# Patient Record
Sex: Female | Born: 1980 | Race: Asian | Hispanic: No | Marital: Married | Smoking: Never smoker
Health system: Southern US, Community
[De-identification: ages and names within clinical notes are randomized; demographics above are authoritative.]

## PROBLEM LIST (undated history)

## (undated) HISTORY — PX: NASAL SINUS SURGERY: SHX719

---

## 2013-04-19 HISTORY — PX: AUGMENTATION MAMMAPLASTY: SUR837

## 2016-01-27 ENCOUNTER — Encounter: Payer: Self-pay | Admitting: Family Medicine

## 2016-01-27 ENCOUNTER — Ambulatory Visit (INDEPENDENT_AMBULATORY_CARE_PROVIDER_SITE_OTHER): Payer: Commercial Managed Care - HMO | Admitting: Nurse Practitioner

## 2016-01-27 ENCOUNTER — Encounter: Payer: Self-pay | Admitting: Nurse Practitioner

## 2016-01-27 VITALS — BP 110/62 | Ht 66.0 in | Wt 168.6 lb

## 2016-01-27 DIAGNOSIS — B3731 Acute candidiasis of vulva and vagina: Secondary | ICD-10-CM

## 2016-01-27 DIAGNOSIS — R5383 Other fatigue: Secondary | ICD-10-CM | POA: Diagnosis not present

## 2016-01-27 DIAGNOSIS — Z13220 Encounter for screening for lipoid disorders: Secondary | ICD-10-CM | POA: Diagnosis not present

## 2016-01-27 DIAGNOSIS — Z79899 Other long term (current) drug therapy: Secondary | ICD-10-CM

## 2016-01-27 DIAGNOSIS — Z1151 Encounter for screening for human papillomavirus (HPV): Secondary | ICD-10-CM

## 2016-01-27 DIAGNOSIS — R87619 Unspecified abnormal cytological findings in specimens from cervix uteri: Secondary | ICD-10-CM

## 2016-01-27 DIAGNOSIS — Z01419 Encounter for gynecological examination (general) (routine) without abnormal findings: Secondary | ICD-10-CM

## 2016-01-27 DIAGNOSIS — B373 Candidiasis of vulva and vagina: Secondary | ICD-10-CM | POA: Diagnosis not present

## 2016-01-27 DIAGNOSIS — Z124 Encounter for screening for malignant neoplasm of cervix: Secondary | ICD-10-CM

## 2016-01-27 DIAGNOSIS — Z91011 Allergy to milk products: Secondary | ICD-10-CM | POA: Diagnosis not present

## 2016-01-27 MED ORDER — FLUCONAZOLE 150 MG PO TABS: ORAL_TABLET | ORAL | 0 refills | Status: DC

## 2016-01-27 NOTE — Progress Notes (Signed)
   Subjective:    Patient ID: Judy Johnson, female    DOB: 10-16-1980, 35 y.o.   MRN: [id number]  HPI presents for her preventive health physical. Patient is from [place], some difficulty obtaining history. Speaks fair English. Married, same partner. He has had a vasectomy. Patient having regular menses, normal flow lasting about 6 days. She is a new patient, has gained about 10 pounds over the past year. Regular dental care. Some walking as activity. Overall healthy diet. Her husband is present at the end of the visit and mentioned that she's been having some rashes when she has anything dairy. Requesting referral to allergy specialist for evaluation. Difficulty obtaining a family history, nothing significant from what can be understood.    Review of Systems  Constitutional: Negative for activity change, appetite change, fatigue and fever.  HENT: Negative for dental problem, ear pain, sinus pressure and sore throat.   Eyes: Negative for visual disturbance.  Respiratory: Negative for cough, chest tightness, shortness of breath and wheezing.   Cardiovascular: Negative for chest pain.  Gastrointestinal: Negative for abdominal distention, abdominal pain, constipation, diarrhea, nausea and vomiting.  Genitourinary: Positive for vaginal discharge. Negative for difficulty urinating, dysuria, enuresis, frequency, genital sores, menstrual problem, pelvic pain and urgency.       Mild external vaginal itching for the past few days, mainly in the am. Has noticed white clumpy discharge.        Objective:   Physical Exam  Constitutional: She is oriented to person, place, and time. She appears well-developed. No distress.  HENT:  Right Ear: External ear normal.  Left Ear: External ear normal.  Mouth/Throat: Oropharynx is clear and moist.  Neck: Normal range of motion. Neck supple. No tracheal deviation present. No thyromegaly present.  Cardiovascular: Normal rate, regular rhythm and normal heart  sounds.  Exam reveals no gallop.   No murmur heard. Pulmonary/Chest: Effort normal and breath sounds normal.  Abdominal: Soft. She exhibits no distension. There is no tenderness.  Genitourinary: Vagina normal and uterus normal. No vaginal discharge found.  Genitourinary Comments: External GU no rashes or lesions. Vagina moderate amount of clumpy white discharge noted. Cervix normal in appearance. No CMT. Bimanual exam no tenderness or obvious masses.  Musculoskeletal: She exhibits no edema.  Lymphadenopathy:    She has no cervical adenopathy.  Neurological: She is alert and oriented to person, place, and time.  Skin: Skin is warm and dry. No rash noted.  Psychiatric: She has a normal mood and affect. Her behavior is normal.  Vitals reviewed.  Breast exam: Extremely dense tissue, no obvious masses. Axillae no adenopathy.        Assessment & Plan:  Well woman exam - Plan: Pap IG and HPV (high risk) DNA detection  Screening for cervical cancer - Plan: Pap IG and HPV (high risk) DNA detection  Screening for HPV (human papillomavirus) - Plan: Pap IG and HPV (high risk) DNA detection  Vaginal candidiasis  Fatigue, unspecified type - Plan: CBC with Differential/Platelet, TSH, VITAMIN D 25 Hydroxy (Vit-D Deficiency, Fractures)  High risk medication use - Plan: Basic metabolic panel, Hepatic function panel  Screening cholesterol level - Plan: Lipid panel  Encouraged increased activity and healthy diet. Will refer to allergy specialist for evaluation per request. Return in about 1 year (around 01/26/2017) for physical.

## 2016-02-03 LAB — PAP IG AND HPV HIGH-RISK
HPV, HIGH-RISK: POSITIVE — AB
PAP SMEAR COMMENT: 0

## 2016-02-06 ENCOUNTER — Encounter: Payer: Self-pay | Admitting: Nurse Practitioner

## 2016-02-06 DIAGNOSIS — R87612 Low grade squamous intraepithelial lesion on cytologic smear of cervix (LGSIL): Secondary | ICD-10-CM | POA: Insufficient documentation

## 2016-02-06 DIAGNOSIS — R87810 Cervical high risk human papillomavirus (HPV) DNA test positive: Secondary | ICD-10-CM | POA: Insufficient documentation

## 2016-02-07 LAB — BASIC METABOLIC PANEL
BUN/Creatinine Ratio: 15 (ref 9–23)
BUN: 12 mg/dL (ref 6–20)
CALCIUM: 9.6 mg/dL (ref 8.7–10.2)
CHLORIDE: 102 mmol/L (ref 96–106)
CO2: 23 mmol/L (ref 18–29)
CREATININE: 0.8 mg/dL (ref 0.57–1.00)
GFR calc Af Amer: 111 mL/min/{1.73_m2} (ref >59)
GFR calc non Af Amer: 96 mL/min/{1.73_m2} (ref >59)
GLUCOSE: 96 mg/dL (ref 65–99)
Potassium: 4.5 mmol/L (ref 3.5–5.2)
Sodium: 141 mmol/L (ref 134–144)

## 2016-02-07 LAB — VITAMIN D 25 HYDROXY (VIT D DEFICIENCY, FRACTURES): VIT D 25 HYDROXY: 18.5 ng/mL — AB (ref 30.0–100.0)

## 2016-02-07 LAB — HEPATIC FUNCTION PANEL
ALK PHOS: 55 IU/L (ref 39–117)
ALT: 12 IU/L (ref 0–32)
AST: 14 IU/L (ref 0–40)
Albumin: 4.5 g/dL (ref 3.5–5.5)
Bilirubin Total: 0.5 mg/dL (ref 0.0–1.2)
Bilirubin, Direct: 0.1 mg/dL (ref 0.00–0.40)
Total Protein: 7.6 g/dL (ref 6.0–8.5)

## 2016-02-07 LAB — CBC WITH DIFFERENTIAL/PLATELET
BASOS ABS: 0 10*3/uL (ref 0.0–0.2)
BASOS: 0 %
EOS (ABSOLUTE): 0.3 10*3/uL (ref 0.0–0.4)
Eos: 7 %
HEMOGLOBIN: 13.7 g/dL (ref 11.1–15.9)
Hematocrit: 40.7 % (ref 34.0–46.6)
IMMATURE GRANS (ABS): 0 10*3/uL (ref 0.0–0.1)
IMMATURE GRANULOCYTES: 0 %
LYMPHS: 40 %
Lymphocytes Absolute: 1.9 10*3/uL (ref 0.7–3.1)
MCH: 29.5 pg (ref 26.6–33.0)
MCHC: 33.7 g/dL (ref 31.5–35.7)
MCV: 88 fL (ref 79–97)
MONOCYTES: 6 %
Monocytes Absolute: 0.3 10*3/uL (ref 0.1–0.9)
NEUTROS PCT: 47 %
Neutrophils Absolute: 2.2 10*3/uL (ref 1.4–7.0)
PLATELETS: 304 10*3/uL (ref 150–379)
RBC: 4.64 x10E6/uL (ref 3.77–5.28)
RDW: 14.5 % (ref 12.3–15.4)
WBC: 4.8 10*3/uL (ref 3.4–10.8)

## 2016-02-07 LAB — LIPID PANEL
CHOLESTEROL TOTAL: 198 mg/dL (ref 100–199)
Chol/HDL Ratio: 4.8 ratio units — ABNORMAL HIGH (ref 0.0–4.4)
HDL: 41 mg/dL (ref >39)
LDL Calculated: 124 mg/dL — ABNORMAL HIGH (ref 0–99)
TRIGLYCERIDES: 163 mg/dL — AB (ref 0–149)
VLDL CHOLESTEROL CAL: 33 mg/dL (ref 5–40)

## 2016-02-07 LAB — TSH: TSH: 1.77 u[IU]/mL (ref 0.450–4.500)

## 2016-02-13 ENCOUNTER — Encounter: Payer: Self-pay | Admitting: Family Medicine

## 2016-02-13 NOTE — Addendum Note (Signed)
Addended by: Margaretha SheffieldBROWN, AUTUMN S on: 02/13/2016 09:37 AM   Modules accepted: Orders

## 2016-02-16 ENCOUNTER — Other Ambulatory Visit: Payer: Self-pay | Admitting: Nurse Practitioner

## 2016-02-16 MED ORDER — VITAMIN D (ERGOCALCIFEROL) 1.25 MG (50000 UNIT) PO CAPS: 50000.0000 [IU] | ORAL_CAPSULE | ORAL | 2 refills | Status: DC

## 2016-02-17 ENCOUNTER — Encounter: Payer: Self-pay | Admitting: *Deleted

## 2016-03-01 ENCOUNTER — Other Ambulatory Visit: Payer: Self-pay | Admitting: Obstetrics and Gynecology

## 2016-03-01 ENCOUNTER — Ambulatory Visit (INDEPENDENT_AMBULATORY_CARE_PROVIDER_SITE_OTHER): Payer: Commercial Managed Care - HMO | Admitting: Obstetrics and Gynecology

## 2016-03-01 ENCOUNTER — Encounter: Payer: Self-pay | Admitting: Obstetrics and Gynecology

## 2016-03-01 VITALS — BP 96/64 | HR 64 | Ht 66.0 in | Wt 167.2 lb

## 2016-03-01 DIAGNOSIS — R87619 Unspecified abnormal cytological findings in specimens from cervix uteri: Secondary | ICD-10-CM

## 2016-03-01 DIAGNOSIS — N870 Mild cervical dysplasia: Secondary | ICD-10-CM | POA: Diagnosis not present

## 2016-03-01 DIAGNOSIS — R87612 Low grade squamous intraepithelial lesion on cytologic smear of cervix (LGSIL): Secondary | ICD-10-CM | POA: Diagnosis not present

## 2016-03-01 NOTE — Progress Notes (Signed)
Patient ID: Jodelle RedSutharat Sahakian, female   DOB: 1980-11-06, 35 y.o.   MRN: [id number]  Chief Complaint  Patient presents with  . Colposcopy    abnormal pap    Colposcopy Procedure Note  Isobel Starks 35 y.o. No obstetric history on file. here for colposcopy for low-grade squamous intraepithelial neoplasia (LGSIL - encompassing HPV,mild dysplasia,CIN I) pap smear on 01/27/16.   Discussed role for HPV in cervical dysplasia, need for surveillance.  Patient given informed consent, signed copy in the chart, time out was performed.  Placed in lithotomy position. Cervix viewed with speculum and colposcope after application of acetic acid.   Colposcopy adequate? Yes There are acetowhite lesions at 6 o'clock and 8 o'clock on the posterior lip; biopsies obtained at 6 o'clock and 8 o'clock.  ECC specimen obtained.  All specimens were labelled and sent to pathology.   Colposcopy IMPRESSION: low grade abnormalities  LSIL  folowup bx by MyChart. Patient was given post procedure instructions. Will follow up pathology and manage accordingly.  Routine preventative health maintenance measures emphasized.    By signing my name below, I, Doreatha MartinEva Mathews, attest that this documentation has been prepared under the direction and in the presence of Tilda BurrowJohn V Shacora Zynda, MD. Electronically Signed: Doreatha MartinEva Mathews, ED Scribe. 03/01/16. 10:38 AM.  I personally performed the services described in this documentation, which was SCRIBED in my presence. The recorded information has been reviewed and considered accurate. It has been edited as necessary during review. Tilda BurrowFERGUSON,Lamari Beckles V, MD

## 2016-03-04 ENCOUNTER — Telehealth: Payer: Self-pay | Admitting: *Deleted

## 2016-03-16 ENCOUNTER — Ambulatory Visit (INDEPENDENT_AMBULATORY_CARE_PROVIDER_SITE_OTHER): Payer: Commercial Managed Care - HMO | Admitting: Allergy & Immunology

## 2016-03-16 ENCOUNTER — Encounter: Payer: Self-pay | Admitting: Allergy & Immunology

## 2016-03-16 ENCOUNTER — Encounter (INDEPENDENT_AMBULATORY_CARE_PROVIDER_SITE_OTHER): Payer: Self-pay

## 2016-03-16 VITALS — BP 120/72 | HR 63 | Temp 97.7°F | Ht 66.0 in | Wt 171.6 lb

## 2016-03-16 DIAGNOSIS — J3089 Other allergic rhinitis: Secondary | ICD-10-CM | POA: Diagnosis not present

## 2016-03-16 DIAGNOSIS — T781XXD Other adverse food reactions, not elsewhere classified, subsequent encounter: Secondary | ICD-10-CM

## 2016-03-16 DIAGNOSIS — R21 Rash and other nonspecific skin eruption: Secondary | ICD-10-CM | POA: Diagnosis not present

## 2016-03-16 MED ORDER — TRIAMCINOLONE ACETONIDE 0.1 % EX OINT: 1.0000 "application " | TOPICAL_OINTMENT | Freq: Two times a day (BID) | CUTANEOUS | 5 refills | Status: DC | PRN

## 2016-03-16 MED ORDER — AZELASTINE HCL 0.1 % NA SOLN: 2.0000 | Freq: Two times a day (BID) | NASAL | 5 refills | Status: DC

## 2016-03-16 MED ORDER — FLUTICASONE PROPIONATE 50 MCG/ACT NA SUSP: 2.0000 | Freq: Every day | NASAL | 5 refills | Status: DC

## 2016-03-16 NOTE — Progress Notes (Signed)
NEW PATIENT  Date of Service/Encounter:  03/16/16   Assessment:   Chronic nonseasonal allergic rhinitis due to fungal spores - SPT (03/16/16): positive to [place] grass, Johnson grass, ragweed, weed mix, tree mix, mold mix 1-4, cat, dog, and dust mite  - Plan: Allergy Test, Interdermal Allergy Test  Rash  Adverse food reaction, subsequent encounter - Plan: Allergy Test    Plan/Recommendations:   1. Chronic rhinitis - Testing showed: positives to [place] grass, [place] grass, weeds, ragweed, trees, all mold mixes, cat, dog, and dust mite - Start Flonase two sprays per nostril daily (use every day for the best effect) - Start Astelin 2 sprays per nostril 1-2 times daily. - Start Allegra 148m tablet once daily. - Avoidance measures discussed.  - Consider allergy shots in the future if there is no improvement with medications. - Allergy shots discussed briefly, but Consent was not obtained.  2. Rash - Start triamcinolone 0.1% ointment twice daily as needed to the worst areas. - Testing for milk and peanut was negative, therefore no need to continue to avoid these.   3. Return in about 3 months (around 06/16/2016).    Subjective:   SZahli Vetschis a 35y.o. female presenting today for evaluation of  Chief Complaint  Patient presents with  . New Evaluation  . Allergy Testing    SDalesha Stanbackhas a history of the following: Patient Active Problem List   Diagnosis Date Noted  . Chronic nonseasonal allergic rhinitis due to fungal spores 03/16/2016  . LGSIL on Pap smear of cervix 02/06/2016  . Cervical high risk HPV (human papillomavirus) test positive 02/06/2016    History obtained from: chart review, patient, and patient's husband.  Evvie Sadlon was referred by SMickie Hillier MD.     SLouiseis a 35y.o. female presenting with concern for environmental and food allergy testing. [place] She feels that she may have  environmental and food allergies. She tends to have breakouts with milk. [place] more when she [place]. She has had no systemic symptoms with the milk. She is fine with the other foods. The rash is very itchy. [place]   Serayah has nasal congestion since she moved here. She feels that the rain and cold makes it worse. She is using Flonase which she is using only as needed. She is not using any antihistamines but has tried Pseudofed. She is also using Afrin. She does have itchy watery eyes. [place] She has never been allergy tested in the past.    She does not have asthma and has never needed an inhaler. She denies coughing and wheezing. Aside from the rash on her upper back, there is no history of eczema or urticaria. Otherwise, there is no history of other atopic diseases, including asthma, drug allergies, stinging insect allergies, or urticaria. She did have a history of Strep throat when she was a child, otherwise there is no significant infectious history. Vaccinations are up to date.    Past Medical History: Patient Active Problem List   Diagnosis Date Noted  . Chronic nonseasonal allergic rhinitis due to fungal spores 03/16/2016  . LGSIL on Pap smear of cervix 02/06/2016  . Cervical high risk HPV (human papillomavirus) test positive 02/06/2016  Medication List:    Medication List       Accurate as of 03/16/16 12:00 PM. Always use your most recent med list.          azelastine 0.1 % nasal spray Commonly known as:  ASTELIN Place 2 sprays into both nostrils 2 (two) times daily. Use in each nostril as directed   fluticasone 50 MCG/ACT nasal spray Commonly known as:  FLONASE Place 2 sprays into both nostrils daily.   triamcinolone ointment 0.1 % Commonly known as:  KENALOG Apply  1 application topically 2 (two) times daily as needed.   Vitamin D (Ergocalciferol) 50000 units Caps capsule Commonly known as:  DRISDOL Take 1 capsule (50,000 Units total) by mouth every 7 (seven) days.       Birth History: non-contributory  Developmental History: Jamyia has met all milestones on time. She has required no speech therapy, occupational therapy, or physical therapy.   Past Surgical History: Past Surgical History:  Procedure Laterality Date  . NASAL SINUS SURGERY       Family History: Family History  Problem Relation Age of Onset  . Cervical cancer Mother   . Asthma Neg Hx   . Allergic rhinitis Neg Hx   . Angioedema Neg Hx   . Atopy Neg Hx   . Eczema Neg Hx   . Immunodeficiency Neg Hx   . Urticaria Neg Hx      Social History: Crystalle lives at home with her husband and three children - all of whom are from her husband's previous relationship(s) - a 6yo daughter, 10yo son, and 10yo daughter. She currently does not work outside of the home. They live in a 34yo home with hardwoods throughout. They have electric heating and central cooling. There are dogs, one cat, and turtles in the home as well as a goat outside of the home. There are no roach or mildew problems. There is no tobacco exposure inside or outside of the home.    Review of Systems: a 14-point review of systems is pertinent for what is mentioned in HPI.  Otherwise, all other systems were negative. Constitutional: negative other than that listed in the HPI Eyes: negative other than that listed in the HPI Ears, nose, mouth, throat, and face: negative other than that listed in the HPI Respiratory: negative other than that listed in the HPI Cardiovascular: negative other than that listed in the HPI Gastrointestinal: negative other than that listed in the HPI Genitourinary: negative other than that listed in the HPI Integument: negative other than that listed in the HPI Hematologic: negative other  than that listed in the HPI Musculoskeletal: negative other than that listed in the HPI Neurological: negative other than that listed in the HPI Allergy/Immunologic: negative other than that listed in the HPI    Objective:   Blood pressure 120/72, pulse 63, temperature 97.7 F (36.5 C), temperature source Oral, height '5\' 6"'  (1.676 m), weight 171 lb 9.6 oz (77.8 kg), last menstrual period 02/26/2016, SpO2 96 %. Body mass index is 27.7 kg/m.   Physical Exam:  General: Alert, interactive, in no acute distress. Broken English. Cooperative with the exam. HEENT: TMs pearly gray, turbinates edematous and pale with clear discharge, post-pharynx erythematous with posterior cobblestoning throughout.  Neck: Supple without thyromegaly. Adenopathy: no enlarged lymph nodes appreciated in the anterior cervical, occipital, axillary, epitrochlear, inguinal, or popliteal regions Lungs: Clear to auscultation without wheezing, rhonchi or rales. No increased work of breathing. CV: Physiologic splitting of S1/S2, no murmurs. Capillary  refill <2 seconds.  Abdomen: Nondistended, nontender. No guarding or rebound tenderness. Bowel sounds present in all fields and hyperactive  Skin: Dry, mildly hyperpigmented patches on the upper back with excoriation marks present. Extremities:  No clubbing, cyanosis or edema. Neuro:   Grossly intact.   Diagnostic studies:   Allergy Studies:   Indoor/Outdoor Percutaneous Adult Environmental Panel:    Indoor/Outdoor Selected Intradermal Environmental Panel: positive to [place] grass, Johnson grass, ragweed, weed mix, tree mix, mold mix 1-4, cat, dog, and dust mite with adequate controls  Selected Foods Panel (milk, casein, peanut): negative with adequate controls    Salvatore Marvel, MD FAAAAI [facility]

## 2016-03-16 NOTE — Patient Instructions (Addendum)
1. Chronic rhinitis - Testing showed: positives to [place] grass, [place] grass, weeds, ragweed, trees, all mold mixes, cat, dog, and dust mite - Start Flonase two sprays per nostril daily (use every day for the best effect) - Start Patanase 2 sprays per nostril 1-2 times daily. - Start Allegra 180mg  tablet once daily. - Avoidance measures discussed.  - Consider allergy shots in the future if there is no improvement with medications.  2. Rash - Start triamcinolone 0.1% ointment twice daily as needed to the worst areas. - Testing for milk and peanut was negative.  3. Return in about 3 months (around 06/16/2016).  Please inform us of any Emergency Department visits, hospitalizations, or changes in symptoms. Call us before going to the ED for breathing or allergy symptoms since we might be able to fit you in for a sick visit. Feel free to contact us anytime with any questions, problems, or concerns.  It was a pleasure to meet you and your family today!   Websites that have reliable patient information: 1. American Academy of Asthma, Allergy, and Immunology: www.aaaai.org 2. Food Allergy Research and Education (FARE): foodallergy.org 3. Mothers of Asthmatics: http://www.asthmacommunitynetwork.org 4. American College of Allergy, Asthma, and Immunology: www.acaai.org  Reducing Pollen Exposure  The American Academy of Allergy, Asthma and Immunology suggests the following steps to reduce your exposure to pollen during allergy seasons.    1. Do not hang sheets or clothing out to dry; pollen may collect on these items. 2. Do not mow lawns or spend time around freshly cut grass; mowing stirs up pollen. 3. Keep windows closed at night.  Keep car windows closed while driving. 4. Minimize morning activities outdoors, a time when pollen counts are usually at their highest. 5. Stay indoors as much as possible when pollen counts or humidity is high and on windy days when pollen tends to remain in the  air longer. 6. Use air conditioning when possible.  Many air conditioners have filters that trap the pollen spores. 7. Use a HEPA room air filter to remove pollen form the indoor air you breathe.  Control of House Dust Mite Allergen    House dust mites play a major role in allergic asthma and rhinitis.  They occur in environments with high humidity wherever human skin, the food for dust mites is found. High levels have been detected in dust obtained from mattresses, pillows, carpets, upholstered furniture, bed covers, clothes and soft toys.  The principal allergen of the house dust mite is found in its feces.  A gram of dust may contain 1,000 mites and 250,000 fecal particles.  Mite antigen is easily measured in the air during house cleaning activities.    1. Encase mattresses, including the box spring, and pillow, in an air tight cover.  Seal the zipper end of the encased mattresses with wide adhesive tape. 2. Wash the bedding in water of 130 degrees Farenheit weekly.  Avoid cotton comforters/quilts and flannel bedding: the most ideal bed covering is the dacron comforter. 3. Remove all upholstered furniture from the bedroom. 4. Remove carpets, carpet padding, rugs, and non-washable window drapes from the bedroom.  Wash drapes weekly or use plastic window coverings. 5. Remove all non-washable stuffed toys from the bedroom.  Wash stuffed toys weekly. 6. Have the room cleaned frequently with a vacuum cleaner and a damp dust-mop.  The patient should not be in a room which is being cleaned and should wait 1 hour after cleaning before going into the room. 7.  Close and seal all heating outlets in the bedroom.  Otherwise, the room will become filled with dust-laden air.  An electric heater can be used to heat the room. 8. Reduce indoor humidity to less than 50%.  Do not use a humidifier.  Control of Mold Allergen  Mold and fungi can grow on a variety of surfaces provided certain temperature and  moisture conditions exist.  Outdoor molds grow on plants, decaying vegetation and soil.  The major outdoor mold, Alternaria and Cladosporium, are found in very high numbers during hot and dry conditions.  Generally, a late Summer - Fall peak is seen for common outdoor fungal spores.  Rain will temporarily lower outdoor mold spore count, but counts rise rapidly when the rainy period ends.  The most important indoor molds are Aspergillus and Penicillium.  Dark, humid and poorly ventilated basements are ideal sites for mold growth.  The next most common sites of mold growth are the bathroom and the kitchen.  Outdoor MicrosoftMold Control 1. Use air conditioning and keep windows closed 2. Avoid exposure to decaying vegetation. 3. Avoid leaf raking. 4. Avoid grain handling. 5. Consider wearing a face mask if working in moldy areas.  Indoor Mold Control 1. Maintain humidity below 50%. 2. Clean washable surfaces with 5% bleach solution. 3. Remove sources e.g. contaminated carpets.  Control of Dog or Cat Allergen  Avoidance is the best way to manage a dog or cat allergy. If you have a dog or cat and are allergic to dog or cats, consider removing the dog or cat from the home. If you have a dog or cat but don't want to find it a new home, or if your family wants a pet even though someone in the household is allergic, here are some strategies that may help keep symptoms at bay:  1. Keep the pet out of your bedroom and restrict it to only a few rooms. Be advised that keeping the dog or cat in only one room will not limit the allergens to that room. 2. Don't pet, hug or kiss the dog or cat; if you do, wash your hands with soap and water. 3. High-efficiency particulate air (HEPA) cleaners run continuously in a bedroom or living room can reduce allergen levels over time. 4. Regular use of a high-efficiency vacuum cleaner or a central vacuum can reduce allergen levels. 5. Giving your dog or cat a bath at least once a  week can reduce airborne allergen.

## 2016-07-13 ENCOUNTER — Ambulatory Visit: Payer: Commercial Managed Care - HMO | Admitting: Allergy and Immunology

## 2017-01-03 ENCOUNTER — Other Ambulatory Visit: Payer: Self-pay | Admitting: Allergy & Immunology

## 2017-01-03 ENCOUNTER — Other Ambulatory Visit: Payer: Self-pay

## 2017-01-03 NOTE — Telephone Encounter (Signed)
Received fax for 90 day supply of Azelastine 0.1% and Fluticasone. Patient was last seen in 03/16/2016. Patient was suppose to return in 3 months, she had an appt on 07/13/2016 but was a no show.  Patient needs an office visit for refills

## 2017-02-02 ENCOUNTER — Encounter: Payer: Self-pay | Admitting: Family Medicine

## 2017-02-02 ENCOUNTER — Ambulatory Visit (INDEPENDENT_AMBULATORY_CARE_PROVIDER_SITE_OTHER): Payer: 59 | Admitting: Family Medicine

## 2017-02-02 VITALS — BP 108/80 | Temp 98.1°F | Ht 66.0 in | Wt 174.1 lb

## 2017-02-02 DIAGNOSIS — J019 Acute sinusitis, unspecified: Secondary | ICD-10-CM | POA: Diagnosis not present

## 2017-02-02 DIAGNOSIS — J301 Allergic rhinitis due to pollen: Secondary | ICD-10-CM

## 2017-02-02 MED ORDER — AMOXICILLIN 500 MG PO TABS: 500.0000 mg | ORAL_TABLET | Freq: Three times a day (TID) | ORAL | 0 refills | Status: DC

## 2017-02-02 NOTE — Progress Notes (Signed)
   Subjective:    Patient ID: Judy Johnson, female    DOB: 18-Jul-1980, 36 y.o.   MRN: [id number]  HPI Patient is here today with complaints of nasal congestion and head ache and dizziness.Throwing up in the am the last two days she says no chance of being pregnant she started her menses on Monday. Dizziness is worse when she moves. Right ear pain. Significant time spent with patient discussing symptoms relates a lot of head congestion drainage coughing symptoms been going on for about 2 weeks in addition to this also having head congestion sinus pressure and also some nausea vomiting over the weekend although that is settling down she also had some pain and some throat burning PMH benign denies any wheezing or difficulty breathing is using allergy nasal sprays and allergy medicine  Review of Systems  Constitutional: Negative for activity change and fever.  HENT: Positive for congestion and rhinorrhea. Negative for ear pain.   Eyes: Negative for discharge.  Respiratory: Positive for cough. Negative for shortness of breath and wheezing.   Cardiovascular: Negative for chest pain.   Please see above    Objective:   Physical Exam  Constitutional: She appears well-developed.  HENT:  Head: Normocephalic.  Right Ear: External ear normal.  Left Ear: External ear normal.  Nose: Nose normal.  Mouth/Throat: Oropharynx is clear and moist. No oropharyngeal exudate.  Eyes: Right eye exhibits no discharge. Left eye exhibits no discharge.  Neck: Neck supple. No tracheal deviation present.  Cardiovascular: Normal rate and normal heart sounds.   No murmur heard. Pulmonary/Chest: Effort normal and breath sounds normal. She has no wheezes. She has no rales.  Abdominal: She exhibits no distension. There is no tenderness. There is no rebound and no guarding.  Lymphadenopathy:    She has no cervical adenopathy.  Skin: Skin is warm and dry.  Nursing note and vitals reviewed.   Abdominal exam is  normal through clothing      Assessment & Plan:  Viral syndrome Secondary rhinosinusitis Antibiotics prescribed warning signs discussed If progressive symptoms or rest follow-up

## 2017-02-14 ENCOUNTER — Other Ambulatory Visit: Payer: Self-pay

## 2017-02-21 ENCOUNTER — Ambulatory Visit (INDEPENDENT_AMBULATORY_CARE_PROVIDER_SITE_OTHER): Payer: 59 | Admitting: Family Medicine

## 2017-02-21 ENCOUNTER — Encounter: Payer: Self-pay | Admitting: Family Medicine

## 2017-02-21 VITALS — BP 110/70 | Ht 65.0 in | Wt 175.1 lb

## 2017-02-21 DIAGNOSIS — Z0000 Encounter for general adult medical examination without abnormal findings: Secondary | ICD-10-CM

## 2017-02-21 DIAGNOSIS — Z23 Encounter for immunization: Secondary | ICD-10-CM

## 2017-02-21 MED ORDER — CEFPROZIL 500 MG PO TABS: 500.0000 mg | ORAL_TABLET | Freq: Two times a day (BID) | ORAL | 0 refills | Status: DC

## 2017-02-21 NOTE — Progress Notes (Signed)
   Subjective:    Patient ID: Judy Johnson, female    DOB: [birth year], 36 y.o.   MRN: [id number]  HPI  The patient comes in today for a wellness visit.    A review of their health history was completed.  A review of medications was also completed.  Any needed refills;  None   Eating habits:  Patient states diet is normal.   Falls/  MVA accidents in past few months: None   Regular exercise: States yes exercises 2-3 hours per week.   Specialist pt sees on regular basis: None   Preventative health issues were discussed.   Additional concerns: Also has concerns of sinus pressure.  t on calcium plus d and iron and b 12 and fish oil and v  Review of Systems  Constitutional: Negative for activity change, appetite change and fatigue.  HENT: Negative for congestion and rhinorrhea.   Eyes: Negative for discharge.  Respiratory: Negative for cough, chest tightness and wheezing.   Cardiovascular: Negative for chest pain.  Gastrointestinal: Negative for abdominal pain, blood in stool and vomiting.  Endocrine: Negative for polyphagia.  Genitourinary: Negative for difficulty urinating and frequency.  Musculoskeletal: Negative for neck pain.  Skin: Negative for color change.  Allergic/Immunologic: Negative for environmental allergies and food allergies.  Neurological: Negative for weakness and headaches.  Psychiatric/Behavioral: Negative for agitation and behavioral problems.  All other systems reviewed and are negative.      Objective:   Physical Exam  Constitutional: She is oriented to person, place, and time. She appears well-developed and well-nourished.  HENT:  Head: Normocephalic and atraumatic.  Right Ear: External ear normal.  Left Ear: External ear normal.  Eyes: Right eye exhibits no discharge. Left eye exhibits no discharge.  Neck: Normal range of motion. No tracheal deviation present.  Cardiovascular: Normal rate, regular rhythm, normal heart sounds and intact  distal pulses. Exam reveals no gallop.  No murmur heard. Pulmonary/Chest: Effort normal and breath sounds normal. No stridor. No respiratory distress. She has no wheezes. She has no rales.  Abdominal: Soft. Bowel sounds are normal. She exhibits no distension and no mass. There is no tenderness. There is no rebound and no guarding.  Genitourinary:  Genitourinary Comments: Pelvic exam and breast exam deferred today.  Patient experiencing menstrual cycle.  Request to wait till next week  Musculoskeletal: Normal range of motion. She exhibits no edema or tenderness.  Lymphadenopathy:    She has no cervical adenopathy.  Neurological: She is alert and oriented to person, place, and time. She exhibits normal muscle tone.  Skin: Skin is warm and dry.  Psychiatric: She has a normal mood and affect. Her behavior is normal.          Assessment & Plan:  Impression 1 wellness exam.  Diet exercise discussed.  Prior blood work discussed.  Will reevaluate blood work again #2 history of abnormal Pap smears, LGSIL, C last year's workup.  Dr. Emelda FearFerguson advised patient to definitely get repeat Pap smear and HPV testing this year.  Per patient request will delay until next week and Eber JonesCarolyn will do then.  Appropriate blood work.  Flu shot.  Exercise discussed and encouraged

## 2017-02-22 DIAGNOSIS — Z0000 Encounter for general adult medical examination without abnormal findings: Secondary | ICD-10-CM | POA: Diagnosis not present

## 2017-02-23 ENCOUNTER — Other Ambulatory Visit: Payer: Self-pay

## 2017-02-23 LAB — LIPID PANEL
CHOLESTEROL TOTAL: 193 mg/dL (ref 100–199)
Chol/HDL Ratio: 5.4 ratio — ABNORMAL HIGH (ref 0.0–4.4)
HDL: 36 mg/dL — ABNORMAL LOW (ref >39)
LDL Calculated: 109 mg/dL — ABNORMAL HIGH (ref 0–99)
Triglycerides: 241 mg/dL — ABNORMAL HIGH (ref 0–149)
VLDL Cholesterol Cal: 48 mg/dL — ABNORMAL HIGH (ref 5–40)

## 2017-02-23 LAB — BASIC METABOLIC PANEL
BUN/Creatinine Ratio: 12 (ref 9–23)
BUN: 9 mg/dL (ref 6–20)
CALCIUM: 9.3 mg/dL (ref 8.7–10.2)
CO2: 23 mmol/L (ref 20–29)
CREATININE: 0.78 mg/dL (ref 0.57–1.00)
Chloride: 103 mmol/L (ref 96–106)
GFR, EST AFRICAN AMERICAN: 114 mL/min/{1.73_m2} (ref >59)
GFR, EST NON AFRICAN AMERICAN: 99 mL/min/{1.73_m2} (ref >59)
Glucose: 93 mg/dL (ref 65–99)
POTASSIUM: 4.7 mmol/L (ref 3.5–5.2)
SODIUM: 139 mmol/L (ref 134–144)

## 2017-02-23 LAB — HEPATIC FUNCTION PANEL
ALBUMIN: 4.6 g/dL (ref 3.5–5.5)
ALK PHOS: 56 IU/L (ref 39–117)
ALT: 8 IU/L (ref 0–32)
AST: 14 IU/L (ref 0–40)
BILIRUBIN TOTAL: 0.3 mg/dL (ref 0.0–1.2)
Bilirubin, Direct: 0.08 mg/dL (ref 0.00–0.40)
TOTAL PROTEIN: 7.5 g/dL (ref 6.0–8.5)

## 2017-02-23 LAB — VITAMIN D 25 HYDROXY (VIT D DEFICIENCY, FRACTURES): Vit D, 25-Hydroxy: 18.2 ng/mL — ABNORMAL LOW (ref 30.0–100.0)

## 2017-03-02 ENCOUNTER — Encounter: Payer: Self-pay | Admitting: Nurse Practitioner

## 2017-03-02 ENCOUNTER — Other Ambulatory Visit: Payer: Self-pay | Admitting: Nurse Practitioner

## 2017-03-02 ENCOUNTER — Ambulatory Visit: Payer: 59 | Admitting: Nurse Practitioner

## 2017-03-02 VITALS — BP 102/68 | Ht 65.0 in | Wt 177.0 lb

## 2017-03-02 DIAGNOSIS — R87612 Low grade squamous intraepithelial lesion on cytologic smear of cervix (LGSIL): Secondary | ICD-10-CM

## 2017-03-02 DIAGNOSIS — R87810 Cervical high risk human papillomavirus (HPV) DNA test positive: Secondary | ICD-10-CM | POA: Diagnosis not present

## 2017-03-02 DIAGNOSIS — R87610 Atypical squamous cells of undetermined significance on cytologic smear of cervix (ASC-US): Secondary | ICD-10-CM | POA: Diagnosis not present

## 2017-03-02 NOTE — Progress Notes (Signed)
   Subjective:    Patient ID: Judy Johnson, female    DOB: 1980-06-09, 36 y.o.   MRN: [id number]  HPI: 36 y/o female presents for pap with HPV screening and breast exam today. Pt denies any changes to breasts or concerns today. She reports regular menstrual cycles with regular flow and denies cramping. She reports noticing a "knot" approximately 2 inches inside the vagina. She states the area has been present for at least two years, but she feels it has grown. She denies pain or discomfort during intercourse, upon palpation, or at rest. She is currently sexually active with the same female partner since her last exam.   Review of Systems  Genitourinary: Negative for difficulty urinating, dysuria, frequency, genital sores, menstrual problem, pelvic pain, urgency, vaginal bleeding, vaginal discharge and vaginal pain.  Skin: Negative for color change and rash.       To breasts       Objective:   Physical Exam  Constitutional: She appears well-developed and well-nourished.  Pulmonary/Chest:  Breasts equal in size and shape bilaterally, no deformity or skin changes noted on inspection. No masses, tenderness, nodularity, edema, or nipple discharge noted on palpation bilaterally. No axillary adenopathy noted bilaterally.   Genitourinary: Uterus normal.  Genitourinary Comments: Vagina- normal muscle tone noted. No redness, erythema, or edema present. Thin, white discharge present with no odor. Palpable difference to appx 3mm area of vaginal tissue on anterior vaginal wall 2.5 inches from introitus. No tenderness, erythema, warmth, edema, discoloration, or visible change to tissue noted. Cervix- thin, white discharge noted with no odor. Cervix friable on manipulation. No CMT noted.    Vitals reviewed.  Vitals:   03/02/17 1037  BP: 102/68      Assessment & Plan:  1. LGSIL on Pap smear of cervix - Pap IG and HPV (high risk) DNA detection  2. Cervical high risk HPV (human papillomavirus) test  positive - Pap IG and HPV (high risk) DNA detection - Pt instructed to contact the office if the area within her vagina appears to grow larger, causes pain or other symptoms, or if she has any concerns.  .Return in about 1 year (around 03/02/2018) for physical.

## 2017-03-06 LAB — PAP IG AND HPV HIGH-RISK
HPV, HIGH-RISK: POSITIVE — AB
PAP SMEAR COMMENT: 0

## 2017-03-08 ENCOUNTER — Encounter: Payer: Self-pay | Admitting: Nurse Practitioner

## 2017-03-14 ENCOUNTER — Other Ambulatory Visit: Payer: Self-pay | Admitting: Allergy & Immunology

## 2017-03-15 ENCOUNTER — Other Ambulatory Visit: Payer: Self-pay | Admitting: Allergy & Immunology

## 2017-03-15 NOTE — Telephone Encounter (Signed)
Received fax for 90 day supply for Azelastine. Patient was last seen 03/16/2016. Patient needs office visit.

## 2017-04-17 ENCOUNTER — Other Ambulatory Visit: Payer: Self-pay | Admitting: Allergy & Immunology

## 2017-04-17 ENCOUNTER — Other Ambulatory Visit: Payer: Self-pay | Admitting: Family Medicine

## 2017-04-18 NOTE — Telephone Encounter (Signed)
Courtesy refill  

## 2017-04-18 NOTE — Telephone Encounter (Signed)
Patient will need to call the office, prescription refused

## 2017-04-18 NOTE — Telephone Encounter (Signed)
No we dont call in abx unseen

## 2017-05-28 ENCOUNTER — Other Ambulatory Visit: Payer: Self-pay | Admitting: Allergy & Immunology

## 2017-06-14 ENCOUNTER — Encounter: Payer: Self-pay | Admitting: Family Medicine

## 2017-06-14 ENCOUNTER — Ambulatory Visit: Payer: 59 | Admitting: Family Medicine

## 2017-06-14 VITALS — BP 112/82 | Temp 98.3°F | Ht 65.0 in | Wt 172.6 lb

## 2017-06-14 DIAGNOSIS — J029 Acute pharyngitis, unspecified: Secondary | ICD-10-CM | POA: Diagnosis not present

## 2017-06-14 MED ORDER — AMOXICILLIN 500 MG PO TABS: 500.0000 mg | ORAL_TABLET | Freq: Three times a day (TID) | ORAL | 0 refills | Status: DC

## 2017-06-14 NOTE — Progress Notes (Signed)
   Subjective:    Patient ID: Judy Johnson, female    DOB: Mar 10, 1981, 37 y.o.   MRN: [id number]  Sinusitis  This is a new problem. The current episode started yesterday. Associated symptoms include a sore throat. Pertinent negatives include no congestion, coughing, ear pain or shortness of breath. (Itchy ear (no pain)) Treatments tried: cough syrup (Hydrocodone) The treatment provided mild relief.  Sore throat discomfort denies high fever chills sweats denies runny nose cough headache body aches energy level fair PMH benign    Review of Systems  Constitutional: Negative for activity change and fever.  HENT: Positive for sore throat. Negative for congestion, ear pain and rhinorrhea.   Eyes: Negative for discharge.  Respiratory: Negative for cough, shortness of breath and wheezing.   Cardiovascular: Negative for chest pain.       Objective:   Physical Exam  Constitutional: She appears well-developed.  HENT:  Head: Normocephalic.  Right Ear: External ear normal.  Left Ear: External ear normal.  Nose: Nose normal.  Mouth/Throat: No oropharyngeal exudate.  Throat erythematous no swelling noted no exudate  Eyes: Right eye exhibits no discharge. Left eye exhibits no discharge.  Neck: Neck supple. No tracheal deviation present.  Cardiovascular: Normal rate and normal heart sounds.  No murmur heard. Pulmonary/Chest: Effort normal and breath sounds normal. She has no wheezes. She has no rales.  Lymphadenopathy:    She has no cervical adenopathy.  Skin: Skin is warm and dry.  Nursing note and vitals reviewed.         Assessment & Plan:  Pharyngitis no evidence of the flu Possible throat infection Antibiotic prescribed warning signs discussed follow-up if ongoing troubles Given severity of sore throat along with some tenderness in the anterior neck we will go ahead and treat with antibiotics

## 2017-07-18 ENCOUNTER — Other Ambulatory Visit: Payer: Self-pay | Admitting: Allergy & Immunology

## 2017-07-20 ENCOUNTER — Other Ambulatory Visit: Payer: Self-pay | Admitting: Allergy & Immunology

## 2017-07-25 ENCOUNTER — Telehealth: Payer: Self-pay | Admitting: Family Medicine

## 2017-07-25 ENCOUNTER — Other Ambulatory Visit: Payer: Self-pay | Admitting: *Deleted

## 2017-07-25 MED ORDER — FLUTICASONE PROPIONATE 50 MCG/ACT NA SUSP: 2.0000 | Freq: Every day | NASAL | 5 refills | Status: DC

## 2017-07-25 MED ORDER — AZELASTINE HCL 0.1 % NA SOLN: 2.0000 | Freq: Two times a day (BID) | NASAL | 5 refills | Status: DC

## 2017-07-25 NOTE — Telephone Encounter (Signed)
Patient is requesting prescriptions for Astelin and Flonase for her allergies.[facility]

## 2017-07-25 NOTE — Telephone Encounter (Signed)
Yes may ref 6 ref ea

## 2017-07-25 NOTE — Telephone Encounter (Signed)
Refills sent to pharm. Pt notified.  

## 2017-07-25 NOTE — Telephone Encounter (Signed)
Please advise 

## 2017-07-26 ENCOUNTER — Other Ambulatory Visit: Payer: Self-pay | Admitting: Allergy

## 2017-07-26 MED ORDER — FLUTICASONE PROPIONATE 50 MCG/ACT NA SUSP: 2.0000 | Freq: Every day | NASAL | 5 refills | Status: DC

## 2017-07-26 MED ORDER — AZELASTINE HCL 0.1 % NA SOLN: 2.0000 | Freq: Two times a day (BID) | NASAL | 0 refills | Status: DC

## 2017-10-27 ENCOUNTER — Emergency Department (HOSPITAL_COMMUNITY): Payer: 59

## 2017-10-27 ENCOUNTER — Other Ambulatory Visit: Payer: Self-pay

## 2017-10-27 ENCOUNTER — Encounter (HOSPITAL_COMMUNITY): Payer: Self-pay

## 2017-10-27 ENCOUNTER — Emergency Department (HOSPITAL_COMMUNITY)
Admission: EM | Admit: 2017-10-27 | Discharge: 2017-10-27 | Disposition: A | Discharge status: Home / Self Care | Payer: 59 | Attending: Emergency Medicine | Admitting: Emergency Medicine

## 2017-10-27 DIAGNOSIS — Z79899 Other long term (current) drug therapy: Secondary | ICD-10-CM | POA: Insufficient documentation

## 2017-10-27 DIAGNOSIS — K5900 Constipation, unspecified: Secondary | ICD-10-CM | POA: Diagnosis not present

## 2017-10-27 DIAGNOSIS — R1033 Periumbilical pain: Secondary | ICD-10-CM | POA: Diagnosis not present

## 2017-10-27 DIAGNOSIS — R079 Chest pain, unspecified: Secondary | ICD-10-CM

## 2017-10-27 DIAGNOSIS — R1084 Generalized abdominal pain: Secondary | ICD-10-CM | POA: Diagnosis not present

## 2017-10-27 DIAGNOSIS — R109 Unspecified abdominal pain: Secondary | ICD-10-CM | POA: Diagnosis not present

## 2017-10-27 DIAGNOSIS — R071 Chest pain on breathing: Secondary | ICD-10-CM | POA: Diagnosis not present

## 2017-10-27 LAB — URINALYSIS, ROUTINE W REFLEX MICROSCOPIC
BACTERIA UA: NONE SEEN
Bilirubin Urine: NEGATIVE
Glucose, UA: NEGATIVE mg/dL
KETONES UR: NEGATIVE mg/dL
Leukocytes, UA: NEGATIVE
Nitrite: NEGATIVE
PROTEIN: NEGATIVE mg/dL
Specific Gravity, Urine: 1.02 (ref 1.005–1.030)
pH: 6 (ref 5.0–8.0)

## 2017-10-27 LAB — COMPREHENSIVE METABOLIC PANEL
ALBUMIN: 3.9 g/dL (ref 3.5–5.0)
ALT: 14 U/L (ref 0–44)
AST: 16 U/L (ref 15–41)
Alkaline Phosphatase: 42 U/L (ref 38–126)
Anion gap: 7 (ref 5–15)
BUN: 20 mg/dL (ref 6–20)
CHLORIDE: 107 mmol/L (ref 98–111)
CO2: 23 mmol/L (ref 22–32)
Calcium: 8.9 mg/dL (ref 8.9–10.3)
Creatinine, Ser: 1.01 mg/dL — ABNORMAL HIGH (ref 0.44–1.00)
GFR calc Af Amer: >60 mL/min (ref >60)
GLUCOSE: 116 mg/dL — AB (ref 70–99)
POTASSIUM: 4 mmol/L (ref 3.5–5.1)
Sodium: 137 mmol/L (ref 135–145)
Total Bilirubin: 0.4 mg/dL (ref 0.3–1.2)
Total Protein: 7.1 g/dL (ref 6.5–8.1)

## 2017-10-27 LAB — TROPONIN I: Troponin I: <0.03 ng/mL (ref <0.03)

## 2017-10-27 LAB — I-STAT BETA HCG BLOOD, ED (MC, WL, AP ONLY): I-stat hCG, quantitative: <5 m[IU]/mL (ref <5)

## 2017-10-27 LAB — CBC WITH DIFFERENTIAL/PLATELET
BASOS ABS: 0 10*3/uL (ref 0.0–0.1)
Basophils Relative: 0 %
Eosinophils Absolute: 0.2 10*3/uL (ref 0.0–0.7)
Eosinophils Relative: 2 %
HCT: 40 % (ref 36.0–46.0)
Hemoglobin: 13.5 g/dL (ref 12.0–15.0)
LYMPHS ABS: 1.9 10*3/uL (ref 0.7–4.0)
Lymphocytes Relative: 19 %
MCH: 31.3 pg (ref 26.0–34.0)
MCHC: 33.8 g/dL (ref 30.0–36.0)
MCV: 92.6 fL (ref 78.0–100.0)
MONO ABS: 0.6 10*3/uL (ref 0.1–1.0)
Monocytes Relative: 6 %
NEUTROS ABS: 7.3 10*3/uL (ref 1.7–7.7)
Neutrophils Relative %: 73 %
Platelets: 248 10*3/uL (ref 150–400)
RBC: 4.32 MIL/uL (ref 3.87–5.11)
RDW: 12.5 % (ref 11.5–15.5)
WBC: 10.1 10*3/uL (ref 4.0–10.5)

## 2017-10-27 LAB — LIPASE, BLOOD: LIPASE: 44 U/L (ref 11–51)

## 2017-10-27 MED ORDER — FENTANYL CITRATE (PF) 100 MCG/2ML IJ SOLN
50.0000 ug | Freq: Once | INTRAMUSCULAR | Status: AC
Start: 2017-10-27 — End: 2017-10-27
  Administered 2017-10-27: 50 ug via INTRAVENOUS
  Filled 2017-10-27: qty 2

## 2017-10-27 MED ORDER — IOPAMIDOL (ISOVUE-300) INJECTION 61%
100.0000 mL | Freq: Once | INTRAVENOUS | Status: AC | PRN
  Administered 2017-10-27: 100 mL via INTRAVENOUS

## 2017-10-27 MED ORDER — SODIUM CHLORIDE 0.9 % IV BOLUS
1000.0000 mL | Freq: Once | INTRAVENOUS | Status: AC
Start: 2017-10-27 — End: 2017-10-27
  Administered 2017-10-27: 1000 mL via INTRAVENOUS

## 2017-10-27 MED ORDER — ONDANSETRON HCL 4 MG/2ML IJ SOLN
4.0000 mg | Freq: Once | INTRAMUSCULAR | Status: AC
  Administered 2017-10-27: 4 mg via INTRAVENOUS
  Filled 2017-10-27: qty 2

## 2017-10-27 NOTE — ED Triage Notes (Signed)
Pt awoke with sudden onset of abd pain, states she had taken a laxative last night and when she got up she had a bm and then the pain started.

## 2017-10-27 NOTE — Discharge Instructions (Addendum)
Drink plenty of fluids (clear liquids) then start a bland diet later this morning such as toast, crackers, jello, [employer] chicken noodle soup.  Get miralax and put one dose or 17 g in 8 ounces of water,  take 1 dose every 30 minutes for 2-3 hours or until you  get good results and then once or twice daily to prevent constipation. Recheck if you get worse.

## 2017-10-27 NOTE — ED Provider Notes (Signed)
[facility] EMERGENCY DEPARTMENT Provider Note   CSN: [id number] Arrival date & time: 10/27/17  0154  Time seen 02:45 AM   History   Chief Complaint Chief Complaint  Patient presents with  . Abdominal Pain    HPI Judy Johnson is a 37 y.o. female.  HPI patient has been feeling like she is constipated and she took a stool softener about 9 PM.  At about 1:30 AM she woke up and she was having central chest burning that went down into her upper abdomen and then to her lower abdomen.  She then went to the bathroom and tried to have a BM and had to strain for a long time and only had a small amount of soft stool come out.  She states now she has lower abdominal pain that she describes as cramping and spasms.  She states the chest pain lasted about 10 minutes and she had trouble breathing with it.  She denies any nausea or vomiting.  She denies having symptoms like this before.  She denies any change in her diet.  PCP Merlyn Albert, MD   History reviewed. No pertinent past medical history.  Patient Active Problem List   Diagnosis Date Noted  . Chronic nonseasonal allergic rhinitis due to fungal spores 03/16/2016  . LGSIL on Pap smear of cervix 02/06/2016  . Cervical high risk HPV (human papillomavirus) test positive 02/06/2016    Past Surgical History:  Procedure Laterality Date  . NASAL SINUS SURGERY       OB History   None      Home Medications    Prior to Admission medications   Medication Sig Start Date End Date Taking? Authorizing Provider  amoxicillin (AMOXIL) 500 MG tablet Take 1 tablet (500 mg total) by mouth 3 (three) times daily. 06/14/17   Babs Sciara, MD  azelastine (ASTELIN) 0.1 % nasal spray Place 2 sprays into both nostrils 2 (two) times daily. Use in each nostril as directed 07/26/17   Alfonse Spruce, MD  fluticasone [facility]) 50 MCG/ACT nasal spray Place 2 sprays into both nostrils daily. 07/26/17   Alfonse Spruce, MD  triamcinolone  ointment (KENALOG) 0.1 % Apply 1 application topically 2 (two) times daily as needed. 03/16/16   Alfonse Spruce, MD  Vitamin D, Ergocalciferol, (DRISDOL) 50000 units CAPS capsule Take 1 capsule (50,000 Units total) by mouth every 7 (seven) days. 02/16/16   Campbell Riches, NP    Family History Family History  Problem Relation Age of Onset  . Cervical cancer Mother   . Asthma Neg Hx   . Allergic rhinitis Neg Hx   . Angioedema Neg Hx   . Atopy Neg Hx   . Eczema Neg Hx   . Immunodeficiency Neg Hx   . Urticaria Neg Hx     Social History Social History   Tobacco Use  . Smoking status: Never Smoker  . Smokeless tobacco: Never Used  Substance Use Topics  . Alcohol use: No  . Drug use: No  unemployed   Allergies   Patient has no known allergies.   Review of Systems Review of Systems  All other systems reviewed and are negative.    Physical Exam Updated Vital Signs BP 112/69   Pulse 64   Temp 97.7 F (36.5 C) (Oral)   Resp 17   Ht 5\' 6"  (1.676 m)   Wt 80.3 kg (177 lb)   LMP 10/13/2017 (Approximate)   SpO2 100%   BMI 28.57  kg/m   Physical Exam  Constitutional: She is oriented to person, place, and time. She appears well-developed and well-nourished.  Non-toxic appearance. She does not appear ill. No distress.  HENT:  Head: Normocephalic and atraumatic.  Right Ear: External ear normal.  Left Ear: External ear normal.  Nose: Nose normal. No mucosal edema or rhinorrhea.  Mouth/Throat: Oropharynx is clear and moist and mucous membranes are normal. No dental abscesses or uvula swelling.  Eyes: Pupils are equal, round, and reactive to light. Conjunctivae and EOM are normal.  Neck: Normal range of motion and full passive range of motion without pain. Neck supple.  Cardiovascular: Normal rate, regular rhythm and normal heart sounds. Exam reveals no gallop and no friction rub.  No murmur heard. Pulmonary/Chest: Effort normal and breath sounds normal. No  respiratory distress. She has no wheezes. She has no rhonchi. She has no rales. She exhibits no tenderness and no crepitus.  Abdominal: Soft. Normal appearance and bowel sounds are normal. She exhibits no distension. There is tenderness in the suprapubic area. There is no rebound and no guarding.  Musculoskeletal: Normal range of motion. She exhibits no edema or tenderness.  Moves all extremities well.   Neurological: She is alert and oriented to person, place, and time. She has normal strength. No cranial nerve deficit.  Skin: Skin is warm, dry and intact. No rash noted. No erythema. No pallor.  Psychiatric: She has a normal mood and affect. Her speech is normal and behavior is normal. Her mood appears not anxious.  Nursing note and vitals reviewed.    ED Treatments / Results  Labs (all labs ordered are listed, but only abnormal results are displayed) Results for orders placed or performed during the hospital encounter of 10/27/17  Urinalysis, Routine w reflex microscopic  Result Value Ref Range   Color, Urine YELLOW YELLOW   APPearance CLEAR CLEAR   Specific Gravity, Urine 1.020 1.005 - 1.030   pH 6.0 5.0 - 8.0   Glucose, UA NEGATIVE NEGATIVE mg/dL   Hgb urine dipstick SMALL (A) NEGATIVE   Bilirubin Urine NEGATIVE NEGATIVE   Ketones, ur NEGATIVE NEGATIVE mg/dL   Protein, ur NEGATIVE NEGATIVE mg/dL   Nitrite NEGATIVE NEGATIVE   Leukocytes, UA NEGATIVE NEGATIVE   RBC / HPF 0-5 0 - 5 RBC/hpf   WBC, UA 0-5 0 - 5 WBC/hpf   Bacteria, UA NONE SEEN NONE SEEN   Squamous Epithelial / LPF 0-5 0 - 5   Mucus PRESENT   Comprehensive metabolic panel  Result Value Ref Range   Sodium 137 135 - 145 mmol/L   Potassium 4.0 3.5 - 5.1 mmol/L   Chloride 107 98 - 111 mmol/L   CO2 23 22 - 32 mmol/L   Glucose, Bld 116 (H) 70 - 99 mg/dL   BUN 20 6 - 20 mg/dL   Creatinine, Ser 4.541.01 (H) 0.44 - 1.00 mg/dL   Calcium 8.9 8.9 - 09.810.3 mg/dL   Total Protein 7.1 6.5 - 8.1 g/dL   Albumin 3.9 3.5 - 5.0 g/dL     AST 16 15 - 41 U/L   ALT 14 0 - 44 U/L   Alkaline Phosphatase 42 38 - 126 U/L   Total Bilirubin 0.4 0.3 - 1.2 mg/dL   GFR calc non Af Amer >60 >60 mL/min   GFR calc Af Amer >60 >60 mL/min   Anion gap 7 5 - 15  Lipase, blood  Result Value Ref Range   Lipase 44 11 - 51 U/L  CBC with Differential  Result Value Ref Range   WBC 10.1 4.0 - 10.5 K/uL   RBC 4.32 3.87 - 5.11 MIL/uL   Hemoglobin 13.5 12.0 - 15.0 g/dL   HCT 40.9 81.1 - 91.4 %   MCV 92.6 78.0 - 100.0 fL   MCH 31.3 26.0 - 34.0 pg   MCHC 33.8 30.0 - 36.0 g/dL   RDW 78.2 95.6 - 21.3 %   Platelets 248 150 - 400 K/uL   Neutrophils Relative % 73 %   Neutro Abs 7.3 1.7 - 7.7 K/uL   Lymphocytes Relative 19 %   Lymphs Abs 1.9 0.7 - 4.0 K/uL   Monocytes Relative 6 %   Monocytes Absolute 0.6 0.1 - 1.0 K/uL   Eosinophils Relative 2 %   Eosinophils Absolute 0.2 0.0 - 0.7 K/uL   Basophils Relative 0 %   Basophils Absolute 0.0 0.0 - 0.1 K/uL  Troponin I  Result Value Ref Range   Troponin I <0.03 <0.03 ng/mL  I-Stat beta hCG blood, ED  Result Value Ref Range   I-stat hCG, quantitative <5.0 <5 mIU/mL   Comment 3           Laboratory interpretation all normal    EKG EKG Interpretation  Date/Time:  Thursday October 27 2017 03:00:14 EDT Ventricular Rate:  69 PR Interval:    QRS Duration: 90 QT Interval:  396 QTC Calculation: 425 R Axis:   95 Text Interpretation:  Sinus rhythm Borderline right axis deviation Baseline wander Otherwise within normal limits No significant change was found Confirmed by Devoria Albe ([id number]) on 10/27/2017 3:24:20 AM   Radiology Dg Chest 2 View  Result Date: 10/27/2017 CLINICAL DATA:  Chest pain. EXAM: CHEST - 2 VIEW COMPARISON:  None. FINDINGS: The cardiomediastinal contours are normal. The lungs are clear. Pulmonary vasculature is normal. No consolidation, pleural effusion, or pneumothorax. No acute osseous abnormalities are seen. IMPRESSION: Negative radiographs of the chest. Electronically Signed    By: Rubye Oaks M.D.   On: 10/27/2017 03:39   Ct Abdomen Pelvis W Contrast  Result Date: 10/27/2017 CLINICAL DATA:  Acute onset abdominal pain after taking laxative, subsequent bowel movement. EXAM: CT ABDOMEN AND PELVIS WITH CONTRAST TECHNIQUE: Multidetector CT imaging of the abdomen and pelvis was performed using the standard protocol following bolus administration of intravenous contrast. CONTRAST:  ISOVUE-300 IOPAMIDOL (ISOVUE-300) INJECTION 61% COMPARISON:  Abdominal radiograph October 27, 2017 FINDINGS: LOWER CHEST: Lung bases are clear. Included heart size is normal. No pericardial effusion. HEPATOBILIARY: Liver and gallbladder are normal. PANCREAS: Normal. SPLEEN: Normal. ADRENALS/URINARY TRACT: Kidneys are orthotopic, demonstrating symmetric enhancement. No nephrolithiasis, hydronephrosis or solid renal masses. The unopacified ureters are normal in course and caliber. Urinary bladder is partially distended and unremarkable. Normal adrenal glands. STOMACH/BOWEL: The stomach, small and large bowel are normal in course and caliber without inflammatory changes. Colonic air-fluid levels. Normal appendix. VASCULAR/LYMPHATIC: Aortoiliac vessels are normal in course and caliber. No lymphadenopathy by CT size criteria. REPRODUCTIVE: Lobulated infiltrative appearing endometrial cavity. OTHER: No intraperitoneal free fluid or free air. MUSCULOSKELETAL: Nonacute. IMPRESSION: 1. Colonic air-fluid levels seen with enteritis. No bowel obstruction. 2. Irregular endometrium.  Recommend non emergent pelvic ultrasound. Electronically Signed   By: Awilda Metro M.D.   On: 10/27/2017 04:25   Dg Abd 2 Views  Result Date: 10/27/2017 CLINICAL DATA:  Constipation. Patient reports taking a laxative last night and having a bowel movement, sudden onset of abdominal pain. EXAM: ABDOMEN - 2 VIEW COMPARISON:  None. FINDINGS: No  bowel dilatation to suggest obstruction. No free intra-abdominal air. Few air-fluid  levels throughout the transverse colon and pelvic small bowel without abnormal distension. No significant formed stool. No abnormal rectal distention. No radiopaque calculi or abnormal soft tissue calcifications. IMPRESSION: Scattered air-fluid levels in nondilated colon and distal small bowel may be due to recent laxative. No significant formed stool. No bowel obstruction or free air. Electronically Signed   By: Rubye Oaks M.D.   On: 10/27/2017 03:39    Procedures Procedures (including critical care time)  Medications Ordered in ED Medications  sodium chloride 0.9 % bolus 1,000 mL (0 mLs Intravenous Stopped 10/27/17 0458)  ondansetron (ZOFRAN) injection 4 mg (4 mg Intravenous Given 10/27/17 0400)  fentaNYL (SUBLIMAZE) injection 50 mcg (50 mcg Intravenous Given 10/27/17 0400)  iopamidol (ISOVUE-300) 61 % injection 100 mL (100 mLs Intravenous Contrast Given 10/27/17 0405)     Initial Impression / Assessment and Plan / ED Course  I have reviewed the triage vital signs and the nursing notes.  Pertinent labs & imaging results that were available during my care of the patient were reviewed by me and considered in my medical decision making (see chart for details).     The patient had x-rays done to see if she had a lot of stool to suggest constipation.  However there did not appear to be a lot of stool in her colon.  Therefore IV was inserted she was given IV pain and nausea medication and a CT scan was done.  At time of discharge we discussed her test results.  We discussed the thickened endometrium which there go follow-up of her primary care doctor.  She was advised on what to do for constipation, when I look at her CT scan she does have a lot of stool throughout her colon.  Final Clinical Impressions(s) / ED Diagnoses   Final diagnoses:  Abdominal pain, unspecified abdominal location  Chest pain, unspecified type  Constipation, unspecified constipation type    ED Discharge Orders     None    OTC miralax  Plan discharge  Devoria Albe, MD, Concha Pyo, MD 10/27/17 [phone]

## 2017-11-17 ENCOUNTER — Other Ambulatory Visit: Payer: Self-pay | Admitting: Family Medicine

## 2018-01-11 ENCOUNTER — Ambulatory Visit: Payer: 59 | Admitting: Family Medicine

## 2018-01-11 ENCOUNTER — Encounter: Payer: Self-pay | Admitting: Family Medicine

## 2018-01-11 VITALS — BP 118/72 | Temp 98.7°F | Ht 66.0 in | Wt 183.0 lb

## 2018-01-11 DIAGNOSIS — J019 Acute sinusitis, unspecified: Secondary | ICD-10-CM

## 2018-01-11 MED ORDER — CEFPROZIL 500 MG PO TABS
500.0000 mg | ORAL_TABLET | Freq: Two times a day (BID) | ORAL | 0 refills | Status: DC
Start: 2018-01-11 — End: 2018-01-16

## 2018-01-11 NOTE — Progress Notes (Signed)
   Subjective:    Patient ID: Jodelle Red, female    DOB: 01/01/1981, 37 y.o.   MRN: [id number]  Sinusitis  This is a new problem. Episode onset: 5 days. Associated symptoms include congestion, coughing, headaches and a sore throat. Treatments tried: dayquil.    fridnose was sore throat with t  Frontal headace   t'  Wed and thur and fri  Wondered if because of ga s  Bo  Day quil  ';  To onthd in thilnd     Review of Systems  HENT: Positive for congestion and sore throat.   Respiratory: Positive for cough.   Neurological: Positive for headaches.       Objective:   Physical Exam  Alert, mild malaise. Hydration good Vitals stable. frontal/ maxillary tenderness evident positive nasal congestion. pharynx normal neck supple  lungs clear/no crackles or wheezes. heart regular in rhythm       Assessment & Plan:  Impression rhinosinusitis likely post viral, discussed with patient. plan antibiotics prescribed. Questions answered. Symptomatic care discussed. warning signs discussed. WSL

## 2018-01-16 ENCOUNTER — Telehealth: Payer: Self-pay | Admitting: Family Medicine

## 2018-01-16 MED ORDER — AMOXICILLIN 500 MG PO CAPS: 500.0000 mg | ORAL_CAPSULE | Freq: Three times a day (TID) | ORAL | 0 refills | Status: DC

## 2018-01-16 NOTE — Telephone Encounter (Signed)
Prescription sent electronically to pharmacy. Husband (DPR) notified. 

## 2018-01-16 NOTE — Telephone Encounter (Signed)
Husband stated he didn't say lashing spell he said she had a rash on her arms that itched since starting antibiotic.

## 2018-01-16 NOTE — Telephone Encounter (Signed)
The antibiotic she is on is causing rash on arms and itching and they want to switch to Amoxil

## 2018-01-16 NOTE — Telephone Encounter (Signed)
im confused, start amox? I thought was on cefzil

## 2018-01-16 NOTE — Telephone Encounter (Signed)
Ok amox 500 tid ten d, and document rash from current med

## 2018-01-16 NOTE — Telephone Encounter (Signed)
Pt was seen on 01/11/18 for sore throat, and runny nose, and cough. Pt is stating antibiotic she was prescribed is making her have "lashing" spells, pt's husband requested if she could start amoxicillin. Advise.

## 2018-02-18 ENCOUNTER — Other Ambulatory Visit: Payer: Self-pay | Admitting: Family Medicine

## 2018-08-16 ENCOUNTER — Other Ambulatory Visit: Payer: Self-pay | Admitting: Family Medicine

## 2018-09-13 ENCOUNTER — Other Ambulatory Visit: Payer: Self-pay | Admitting: Family Medicine

## 2018-09-20 ENCOUNTER — Other Ambulatory Visit: Payer: Self-pay | Admitting: *Deleted

## 2018-09-20 MED ORDER — AZELASTINE HCL 0.1 % NA SOLN: 2.0000 | Freq: Two times a day (BID) | NASAL | 0 refills | Status: DC

## 2018-10-14 ENCOUNTER — Other Ambulatory Visit: Payer: Self-pay | Admitting: Family Medicine

## 2018-11-28 ENCOUNTER — Other Ambulatory Visit: Payer: Self-pay | Admitting: Family Medicine

## 2018-12-20 ENCOUNTER — Other Ambulatory Visit: Payer: Self-pay | Admitting: Family Medicine

## 2019-04-08 IMAGING — DX DG ABDOMEN 2V
2 series · 2 of 2 positions shown · non-contrast
Comparison: None.

CLINICAL DATA: Constipation. Patient reports taking a laxative last
night and having a bowel movement, sudden onset of abdominal pain.

EXAM:
ABDOMEN - 2 VIEW

[abdomen erect]
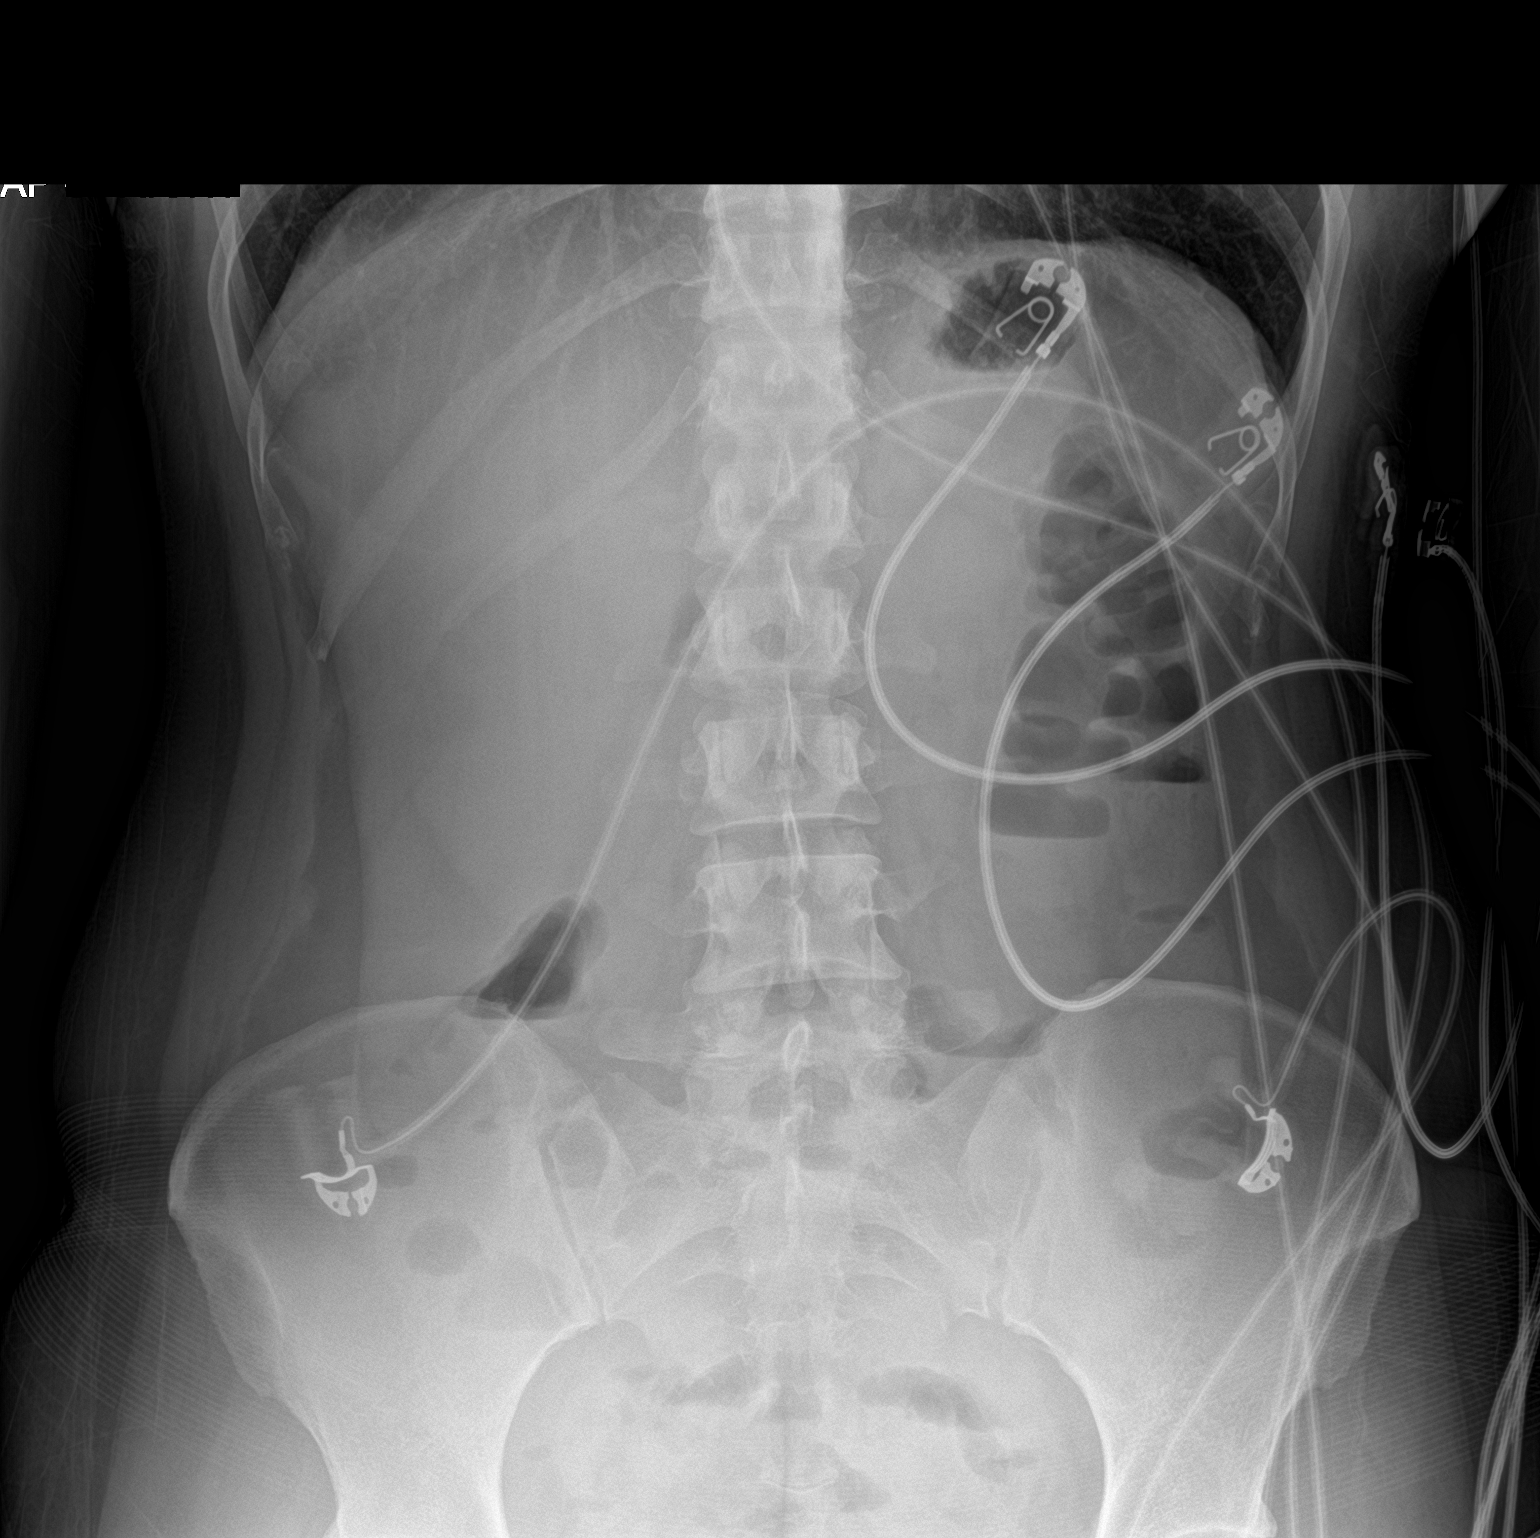

[abdomen supine]
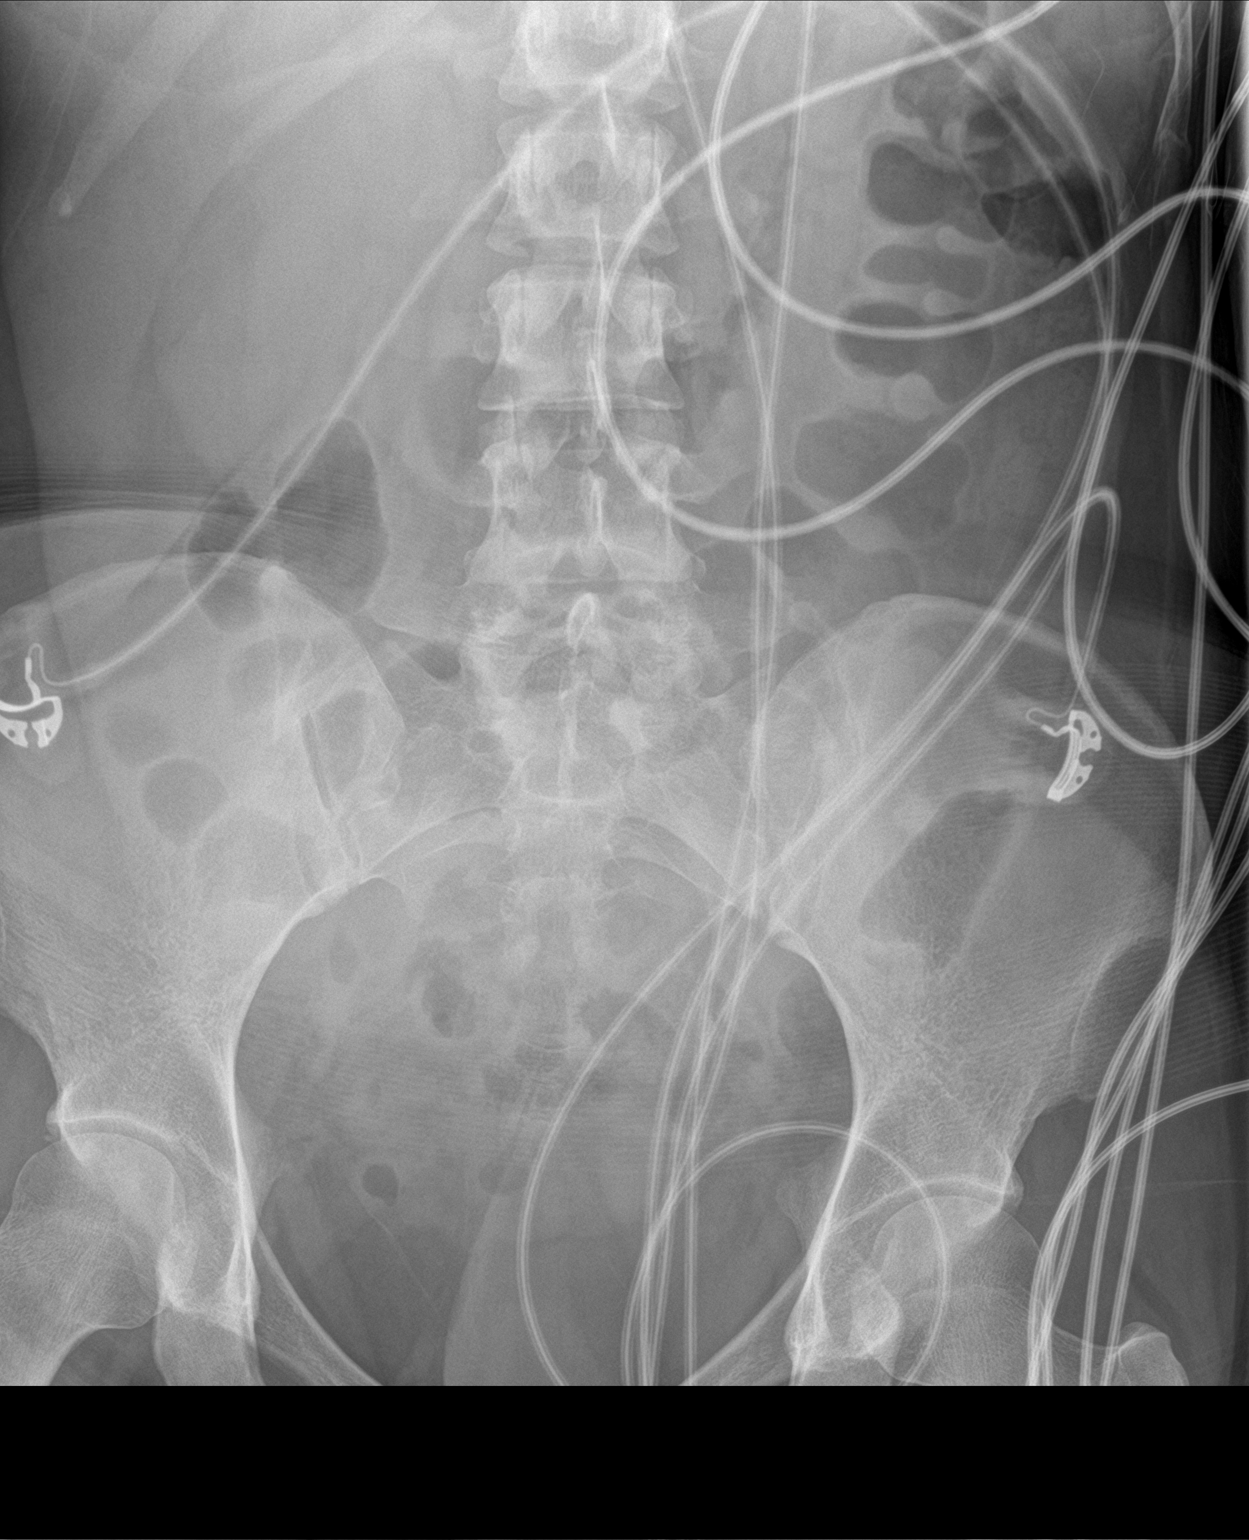

[2 of 2 positions shown; findings below may reference images not displayed]

FINDINGS: No bowel dilatation to suggest obstruction. No free intra-abdominal
air. Few air-fluid levels throughout the transverse colon and pelvic
small bowel without abnormal distension. No significant formed
stool. No abnormal rectal distention. No radiopaque calculi or
abnormal soft tissue calcifications.
IMPRESSION: Scattered air-fluid levels in nondilated colon and distal small
bowel may be due to recent laxative. No significant formed stool. No
bowel obstruction or free air.

## 2019-04-18 ENCOUNTER — Ambulatory Visit (INDEPENDENT_AMBULATORY_CARE_PROVIDER_SITE_OTHER): Payer: 59 | Admitting: Family Medicine

## 2019-04-18 ENCOUNTER — Other Ambulatory Visit: Payer: Self-pay

## 2019-04-18 DIAGNOSIS — L639 Alopecia areata, unspecified: Secondary | ICD-10-CM | POA: Diagnosis not present

## 2019-04-18 MED ORDER — MOMETASONE FUROATE 0.1 % EX CREA: TOPICAL_CREAM | CUTANEOUS | 0 refills | Status: DC

## 2019-04-18 NOTE — Progress Notes (Signed)
   Subjective:  Audio only  Patient ID: Judy Johnson, female    DOB: Nov 06, 1980, 38 y.o.   MRN: [id number]  HPI Pt is having patches of hair fall out. Pt has some bald spots in head. Pt noticed her hair falling out for about 3-4 weeks. No treatments tried.  Virtual Visit via Telephone Note  I connected with Judy Johnson on 04/18/19 at  2:30 PM EST by telephone and verified that I am speaking with the correct person using two identifiers.  Location: Patient: home Provider: office   I discussed the limitations, risks, security and privacy concerns of performing an evaluation and management service by telephone and the availability of in person appointments. I also discussed with the patient that there may be a patient responsible charge related to this service. The patient expressed understanding and agreed to proceed.   History of Present Illness:    Observations/Objective:   Assessment and Plan:   Follow Up Instructions:    I discussed the assessment and treatment plan with the patient. The patient was provided an opportunity to ask questions and all were answered. The patient agreed with the plan and demonstrated an understanding of the instructions.   The patient was advised to call back or seek an in-person evaluation if the symptoms worsen or if the condition fails to improve as anticipated.  I provide 17 minutes of non-face-to-face time during this encounter.   Vicente Males, LPN    Review of Systems No headache, no major weight loss or weight gain, no chest pain no back pain abdominal pain no change in bowel habits complete ROS otherwise negative     Objective:   Physical Exam Virtual       Assessment & Plan:  Impression true patches of hair loss.  Skin itself looks normal.  No thickening.  No scaliness.  Very concerning to the patient.  Options discussed.  Will add topical steroid and do dermatology referral rationale discussed.  Questions answered.      Greater than 50% of this 15 minute non-face to face visit was spent in counseling and discussion and coordination of care regarding the above diagnosis/diagnosies

## 2019-04-24 ENCOUNTER — Encounter: Payer: Self-pay | Admitting: Family Medicine

## 2019-05-21 ENCOUNTER — Encounter: Payer: Self-pay | Admitting: Family Medicine

## 2019-06-01 ENCOUNTER — Ambulatory Visit (INDEPENDENT_AMBULATORY_CARE_PROVIDER_SITE_OTHER): Payer: 59 | Admitting: Family Medicine

## 2019-06-01 ENCOUNTER — Other Ambulatory Visit: Payer: Self-pay

## 2019-06-01 DIAGNOSIS — Z13220 Encounter for screening for lipoid disorders: Secondary | ICD-10-CM | POA: Diagnosis not present

## 2019-06-01 DIAGNOSIS — L659 Nonscarring hair loss, unspecified: Secondary | ICD-10-CM | POA: Diagnosis not present

## 2019-06-01 DIAGNOSIS — R5383 Other fatigue: Secondary | ICD-10-CM

## 2019-06-01 NOTE — Progress Notes (Signed)
   Subjective:  Audio only  Patient ID: Judy Johnson, female    DOB: March 31, 1981, 39 y.o.   MRN: [id number]  HPI  Husband - Brett Canales Patient calls to discuss recent dermatology visit.  Patient would like to have some blood work set up- they told her it could be her iron or her thyroid.  Virtual Visit via Video Note  I connected with Judy Johnson on 06/01/19 at  3:00 PM EST by a video enabled telemedicine application and verified that I am speaking with the correct person using two identifiers.  Location: Patient: home Provider: office   I discussed the limitations of evaluation and management by telemedicine and the availability of in person appointments. The patient expressed understanding and agreed to proceed.  History of Present Illness:    Observations/Objective:   Assessment and Plan:   Follow Up Instructions:    I discussed the assessment and treatment plan with the patient. The patient was provided an opportunity to ask questions and all were answered. The patient agreed with the plan and demonstrated an understanding of the instructions.   The patient was advised to call back or seek an in-person evaluation if the symptoms worsen or if the condition fails to improve as anticipated.  I provided20 minutes of non-face-to-face time during this encounter.   sterpid injections   Derm rec  Ck ing for potential  Maybe thyroid antibodies and   Iron  tsh  Free  t4  Vit d  Thyr antibodies   Cbc and diff   Ferritin    Review of Systems No headache, no major weight loss or weight gain, no chest pain no back pain abdominal pain no change in bowel habits complete ROS otherwise negative     Objective:   Physical Exam  Virtual      Assessment & Plan:  Impression discussion held with patient and s. dermatologist raise a question of potential etiologies for her alopecia.  They recommended a number of blood test.  They stated the family doctor should  do it" if we do it it is considered cosmetic" we will honor the dermatologist request in order the test is recommended along with standard screening test.

## 2019-06-05 LAB — CBC WITH DIFFERENTIAL/PLATELET
Absolute Monocytes: 562 cells/uL (ref 200–950)
Basophils Absolute: 29 cells/uL (ref 0–200)
Basophils Relative: 0.4 %
Eosinophils Absolute: 248 cells/uL (ref 15–500)
Eosinophils Relative: 3.4 %
HCT: 41.6 % (ref 35.0–45.0)
Hemoglobin: 13.5 g/dL (ref 11.7–15.5)
Lymphs Abs: 2051 cells/uL (ref 850–3900)
MCH: 30.8 pg (ref 27.0–33.0)
MCHC: 32.5 g/dL (ref 32.0–36.0)
MCV: 95 fL (ref 80.0–100.0)
MPV: 10 fL (ref 7.5–12.5)
Monocytes Relative: 7.7 %
Neutro Abs: 4409 cells/uL (ref 1500–7800)
Neutrophils Relative %: 60.4 %
Platelets: 276 10*3/uL (ref 140–400)
RBC: 4.38 10*6/uL (ref 3.80–5.10)
RDW: 12.4 % (ref 11.0–15.0)
Total Lymphocyte: 28.1 %
WBC: 7.3 10*3/uL (ref 3.8–10.8)

## 2019-06-05 LAB — BASIC METABOLIC PANEL
BUN: 12 mg/dL (ref 7–25)
CO2: 28 mmol/L (ref 20–32)
Calcium: 9.3 mg/dL (ref 8.6–10.2)
Chloride: 105 mmol/L (ref 98–110)
Creat: 0.83 mg/dL (ref 0.50–1.10)
Glucose, Bld: 106 mg/dL — ABNORMAL HIGH (ref 65–99)
Potassium: 4.6 mmol/L (ref 3.5–5.3)
Sodium: 139 mmol/L (ref 135–146)

## 2019-06-05 LAB — LIPID PANEL
Cholesterol: 229 mg/dL — ABNORMAL HIGH (ref <200)
HDL: 45 mg/dL — ABNORMAL LOW (ref >=50)
LDL Cholesterol (Calc): 148 mg/dL (calc) — ABNORMAL HIGH
Non-HDL Cholesterol (Calc): 184 mg/dL (calc) — ABNORMAL HIGH (ref <130)
Total CHOL/HDL Ratio: 5.1 (calc) — ABNORMAL HIGH (ref <5.0)
Triglycerides: 222 mg/dL — ABNORMAL HIGH (ref <150)

## 2019-06-05 LAB — THYROID ANTIBODIES
Thyroglobulin Ab: 12 IU/mL — ABNORMAL HIGH (ref <=1)
Thyroperoxidase Ab SerPl-aCnc: <1 IU/mL (ref <9)

## 2019-06-05 LAB — HEPATIC FUNCTION PANEL
AG Ratio: 1.7 (calc) (ref 1.0–2.5)
ALT: 9 U/L (ref 6–29)
AST: 9 U/L — ABNORMAL LOW (ref 10–30)
Albumin: 4.4 g/dL (ref 3.6–5.1)
Alkaline phosphatase (APISO): 34 U/L (ref 31–125)
Bilirubin, Direct: 0.1 mg/dL (ref 0.0–0.2)
Globulin: 2.6 g/dL (calc) (ref 1.9–3.7)
Indirect Bilirubin: 0.3 mg/dL (calc) (ref 0.2–1.2)
Total Bilirubin: 0.4 mg/dL (ref 0.2–1.2)
Total Protein: 7 g/dL (ref 6.1–8.1)

## 2019-06-05 LAB — T4, FREE: Free T4: 1.2 ng/dL (ref 0.8–1.8)

## 2019-06-05 LAB — FERRITIN: Ferritin: 25 ng/mL (ref 16–154)

## 2019-06-05 LAB — TSH: TSH: 1.61 mIU/L

## 2019-06-05 LAB — VITAMIN D 25 HYDROXY (VIT D DEFICIENCY, FRACTURES): Vit D, 25-Hydroxy: 17 ng/mL — ABNORMAL LOW (ref 30–100)

## 2019-06-05 LAB — IRON: Iron: 76 ug/dL (ref 40–190)

## 2019-06-10 ENCOUNTER — Encounter: Payer: Self-pay | Admitting: Family Medicine

## 2019-06-28 ENCOUNTER — Other Ambulatory Visit: Payer: Self-pay | Admitting: Family Medicine

## 2019-06-28 ENCOUNTER — Other Ambulatory Visit: Payer: Self-pay | Admitting: *Deleted

## 2019-06-28 DIAGNOSIS — L659 Nonscarring hair loss, unspecified: Secondary | ICD-10-CM

## 2019-06-28 NOTE — Telephone Encounter (Signed)
Wellness 02/21/17

## 2019-07-04 ENCOUNTER — Encounter: Payer: Self-pay | Admitting: Family Medicine

## 2019-07-12 ENCOUNTER — Telehealth: Payer: Self-pay | Admitting: *Deleted

## 2019-07-12 NOTE — Telephone Encounter (Signed)
Yes, I had advised family in the first place not to do thyroid antibody tests as the dermatologist demanded (unless thyroid studies were abnormal). Family was adamant. Now I have the family insisting they see an endocrinologist for a test I did not endorse in the first place. Try to refer to a [place] endocrinologist

## 2019-07-12 NOTE — Telephone Encounter (Signed)
-----   Message ----- From: Alphonzo Lemmings Sent: 07/12/2019   3:30 PM EDT To: [facility] Clinical Pool  Dr. Isidoro Donning office declined to accept referral, "care plan inappropriate" - please advise

## 2019-07-13 NOTE — Telephone Encounter (Signed)
Notify family (steve the husband spks english and works on [employer] endocrinologist reviewed her b w, and stated there was no need to see an endocrinologist. As long as the thryoid function tests are completely normal (and they are) he stated there was nothing that needed to be done from an endocrinologists standpoint, escept he did rec for Suthrat to ck her thyr function tests yrly. If this satisfies steve and Judy Johnson, it certainly satisfies me. If not, try to get endocrin in [place] to see

## 2019-07-13 NOTE — Telephone Encounter (Signed)
Dr. Gerda Diss, [place].  I hope your Friday is going well. I was asked if I could see this patient for hair loss and responded that she would be best served by dermatology.  I see that her thyroid function tests are WNL ( except slight elevation of Thyroglobulin Abs which will not need any thyroid intervention, but could be a sign of underlying  auto immune dysregulation). If there is any good option to treat hair loss from autoimmune dysregulation, dermatologists are the ones to know it and apply it. She may need repeat thyroid function test in a year. I hope this helps. Marquis [phone]

## 2019-07-13 NOTE — Telephone Encounter (Signed)
Husband(DPR) notified per Dr Brett Canales: Notify family our local endocrinologist reviewed her blood work, and stated there was no need to see an endocrinologist. As long as the thryoid function tests are completely normal (and they are) he stated there was nothing that needed to be done from an endocrinologists standpoint, except he did recomend for Judy Johnson to check her thyroid function tests yearly. Husband verbalized understanding and agreed to recommendations.

## 2019-08-30 ENCOUNTER — Ambulatory Visit: Payer: 59 | Admitting: Physician Assistant

## 2019-09-13 ENCOUNTER — Encounter: Payer: Self-pay | Admitting: Physician Assistant

## 2019-09-13 ENCOUNTER — Other Ambulatory Visit: Payer: Self-pay

## 2019-09-13 ENCOUNTER — Ambulatory Visit: Payer: 59 | Admitting: Physician Assistant

## 2019-09-13 DIAGNOSIS — L659 Nonscarring hair loss, unspecified: Secondary | ICD-10-CM | POA: Diagnosis not present

## 2019-09-13 MED ORDER — TRIAMCINOLONE ACETONIDE 10 MG/ML IJ SUSP
10.0000 mg | Freq: Once | INTRAMUSCULAR | Status: AC
  Administered 2019-09-13: 3 mg

## 2019-09-13 NOTE — Progress Notes (Signed)
   Follow up Visit  Subjective  Judy Johnson is a 39 y.o. female who presents for the following: Follow-up (HAIR LOSS, OLD SPOT SOME BETTER BUT NEW SPOT UNDER THE OLD ONE PREVIOUS TREATMENT [place] TAC). New spot has spread from old lesion. New growth is noted in areas of injections. She has started a Vit D supplement but she has not started an iron supplement.   Objective  Well appearing patient in no apparent distress; mood and affect are within normal limits.  A focused examination was performed including scalp. Relevant physical exam findings are noted in the Assessment and Plan.   Objective  Scalp: Areas of total loss have improved with new growth. General thinning noted over entire scalp.  Assessment & Plan  Alopecia Scalp  General and areata. She is to continue her Vit D supplement and begin and iron supplement.   Intralesional injection - Scalp Location: right temporal scalp  Informed Consent: Discussed risks (infection, pain, bleeding, bruising, thinning of the skin, loss of skin pigment,  Indentation, lack of resolution, and recurrence of lesion) and benefits of the procedure, as well as the alternatives. Informed consent was obtained. Preparation: The area was prepared in a standard fashion.   Procedure Details: An intralesional injection was performed with Kenalog 3.3 mg/cc. 0.9 cc in total were injected.  Total number of injections: 5  Plan: The patient was instructed on post-op care. Recommend OTC analgesia as needed for pain.

## 2019-09-29 ENCOUNTER — Other Ambulatory Visit: Payer: Self-pay | Admitting: Family Medicine

## 2019-10-28 ENCOUNTER — Other Ambulatory Visit: Payer: Self-pay | Admitting: Family Medicine

## 2019-10-30 NOTE — Telephone Encounter (Signed)
Scheduled 8/13

## 2019-10-30 NOTE — Telephone Encounter (Signed)
Please schedule visit and then route back  

## 2019-11-15 ENCOUNTER — Ambulatory Visit: Payer: 59 | Admitting: Physician Assistant

## 2019-11-15 ENCOUNTER — Other Ambulatory Visit: Payer: Self-pay

## 2019-11-15 ENCOUNTER — Encounter: Payer: Self-pay | Admitting: Physician Assistant

## 2019-11-15 DIAGNOSIS — L659 Nonscarring hair loss, unspecified: Secondary | ICD-10-CM

## 2019-11-15 NOTE — Progress Notes (Signed)
   Follow up Visit  Subjective  Judy Johnson is a 39 y.o. female who presents for the following: Follow-up (Hair loss we injected TAC 3 mg in scalp. Patient thinks it has helped some. ).  Objective  Well appearing patient in no apparent distress; mood and affect are within normal limits.  A focused examination was performed including scalp, hair. Relevant physical exam findings are noted in the Assessment and Plan.   Objective  Left Temple, Mid Frontal Scalp, Right Temple: Areas of alopecia areata have filled in but her hair is still generally thin within the crown and temporal scalp.  Assessment & Plan  Alopecia (3) Mid Frontal Scalp; Left Temple; Right Temple  Will continue iron and vit d. She will have her labs redone at the beginning of September. She is to begin minoxidil. [facility]omens strength at first and then go to [employer] strength.  Other Related Procedures Iron Ferritin Vitamin D, 25-hydroxy Vitamin D, 25-hydroxy

## 2019-11-26 ENCOUNTER — Other Ambulatory Visit: Payer: Self-pay | Admitting: Family Medicine

## 2019-11-27 NOTE — Telephone Encounter (Signed)
Pt already scheduled 8/13

## 2019-11-30 ENCOUNTER — Encounter: Payer: Self-pay | Admitting: Family Medicine

## 2019-11-30 ENCOUNTER — Ambulatory Visit: Payer: 59 | Admitting: Family Medicine

## 2019-11-30 ENCOUNTER — Other Ambulatory Visit: Payer: Self-pay

## 2019-11-30 VITALS — BP 114/76 | HR 65 | Temp 97.6°F | Wt 178.8 lb

## 2019-11-30 DIAGNOSIS — D509 Iron deficiency anemia, unspecified: Secondary | ICD-10-CM

## 2019-11-30 DIAGNOSIS — E559 Vitamin D deficiency, unspecified: Secondary | ICD-10-CM | POA: Diagnosis not present

## 2019-11-30 DIAGNOSIS — L639 Alopecia areata, unspecified: Secondary | ICD-10-CM

## 2019-11-30 MED ORDER — FERROUS SULFATE 325 (65 FE) MG PO TABS: 325.0000 mg | ORAL_TABLET | Freq: Every day | ORAL | 3 refills | Status: DC

## 2019-11-30 MED ORDER — VITAMIN D (ERGOCALCIFEROL) 1.25 MG (50000 UNIT) PO CAPS: 50000.0000 [IU] | ORAL_CAPSULE | ORAL | 1 refills | Status: DC

## 2019-11-30 NOTE — Progress Notes (Signed)
Patient ID: Judy Johnson, female    DOB: 1980-12-18, 39 y.o.   MRN: [id number]   Chief Complaint  Patient presents with  . Follow-up    Patient is seeing a dermatologist for Alopecia and they would like labs done for Vitamin D and Iron.    Subjective:    HPI Getting some new growth back from alopecia. Has h/o allergies. On lots of allergies nasal spray. Not using topical creams now on skin.  Has impaired fasting at 106, dec carbs   Elevated cholesterol.  Pt wanting to do diet modification to help with lowering cholesterol.  Pt taking iron daily. Pt seen by allergist in past and taking asteline nasal spray. Taking 1x per day vit d tablet.    Medical History Judy Johnson has no past medical history on file.   Outpatient Encounter Medications as of 11/30/2019  Medication Sig  . azelastine (ASTELIN) 0.1 % nasal spray PLACE 2 SPRAYS INTO BOTH NOSTRILS 2 (TWO) TIMES DAILY  . ferrous sulfate (FERROUSUL) 325 (65 FE) MG tablet Take 1 tablet (325 mg total) by mouth daily with breakfast.  . fluticasone (FLONASE) 50 MCG/ACT nasal spray Place 2 sprays into both nostrils daily.  . Vitamin D, Ergocalciferol, (DRISDOL) 1.25 MG (50000 UNIT) CAPS capsule Take 1 capsule (50,000 Units total) by mouth every 7 (seven) days.  . [DISCONTINUED] mometasone (ELOCON) 0.1 % cream Apply thin layer qhs to affected area  . [DISCONTINUED] triamcinolone ointment (KENALOG) 0.1 % Apply 1 application topically 2 (two) times daily as needed.  . [DISCONTINUED] Vitamin D, Ergocalciferol, (DRISDOL) 50000 units CAPS capsule Take 1 capsule (50,000 Units total) by mouth every 7 (seven) days.   No facility-administered encounter medications on file as of 11/30/2019.     Review of Systems  Constitutional: Negative for chills and fever.  HENT: Negative for congestion, rhinorrhea and sore throat.   Respiratory: Negative for cough, shortness of breath and wheezing.   Cardiovascular: Negative for chest pain and leg  swelling.  Gastrointestinal: Negative for abdominal pain, diarrhea, nausea and vomiting.  Endocrine:       +hair loss  Genitourinary: Negative for dysuria and frequency.  Musculoskeletal: Negative for arthralgias and back pain.  Skin: Negative for rash.  Neurological: Negative for dizziness, weakness and headaches.     Vitals BP 114/76   Pulse 65   Temp 97.6 F (36.4 C)   Wt 178 lb 12.8 oz (81.1 kg)   SpO2 98%   BMI 28.86 kg/m   Objective:   Physical Exam Vitals and nursing note reviewed.  Constitutional:      Appearance: Normal appearance.  HENT:     Head: Normocephalic and atraumatic.     Comments: +hair loss at front of hair line and on parietal areas bilaterally.    Nose: Nose normal.     Mouth/Throat:     Mouth: Mucous membranes are moist.     Pharynx: Oropharynx is clear.  Eyes:     Extraocular Movements: Extraocular movements intact.     Conjunctiva/sclera: Conjunctivae normal.     Pupils: Pupils are equal, round, and reactive to light.  Cardiovascular:     Rate and Rhythm: Normal rate and regular rhythm.     Pulses: Normal pulses.     Heart sounds: Normal heart sounds.  Pulmonary:     Effort: Pulmonary effort is normal.     Breath sounds: Normal breath sounds. No wheezing, rhonchi or rales.  Musculoskeletal:        General: Normal range  of motion.     Right lower leg: No edema.     Left lower leg: No edema.  Skin:    General: Skin is warm and dry.     Findings: No lesion or rash.  Neurological:     General: No focal deficit present.     Mental Status: She is alert and oriented to person, place, and time.  Psychiatric:        Mood and Affect: Mood normal.        Behavior: Behavior normal.      Assessment and Plan   1. Alopecia areata  2. Vitamin D deficiency - Vitamin D, Ergocalciferol, (DRISDOL) 1.25 MG (50000 UNIT) CAPS capsule; Take 1 capsule (50,000 Units total) by mouth every 7 (seven) days.  Dispense: 12 capsule; Refill: 1  3. Iron  deficiency anemia, unspecified iron deficiency anemia type - ferrous sulfate (FERROUSUL) 325 (65 FE) MG tablet; Take 1 tablet (325 mg total) by mouth daily with breakfast.  Dispense: 30 tablet; Refill: 3   Cont taking medications as directed by dermatology.  Vit d- cont vit d supplement.  Will recheck next visit. Iron def anemia- cont iron  Tablets daily.  F/u mo or prn.

## 2019-12-23 ENCOUNTER — Other Ambulatory Visit: Payer: Self-pay | Admitting: Family Medicine

## 2020-01-10 ENCOUNTER — Ambulatory Visit: Payer: 59 | Admitting: Physician Assistant

## 2020-01-22 ENCOUNTER — Other Ambulatory Visit: Payer: Self-pay | Admitting: Family Medicine

## 2020-02-08 ENCOUNTER — Telehealth: Payer: Self-pay | Admitting: *Deleted

## 2020-02-08 NOTE — Telephone Encounter (Signed)
[facility] called and there computers are down and they needed the lab orders for patient. I printed off lab orders and faxed them over at [phone].

## 2020-02-14 ENCOUNTER — Ambulatory Visit: Payer: 59 | Admitting: Physician Assistant

## 2020-04-21 ENCOUNTER — Other Ambulatory Visit: Payer: Self-pay | Admitting: Family Medicine

## 2020-05-16 ENCOUNTER — Other Ambulatory Visit: Payer: Self-pay | Admitting: Family Medicine

## 2020-05-22 ENCOUNTER — Other Ambulatory Visit: Payer: Self-pay | Admitting: Family Medicine

## 2020-05-22 DIAGNOSIS — E559 Vitamin D deficiency, unspecified: Secondary | ICD-10-CM

## 2020-05-22 NOTE — Telephone Encounter (Signed)
Seen 11/30/19 and vit d prescribed

## 2020-05-29 ENCOUNTER — Ambulatory Visit: Payer: 59 | Admitting: Family Medicine

## 2020-05-29 ENCOUNTER — Other Ambulatory Visit: Payer: Self-pay

## 2020-05-29 ENCOUNTER — Encounter: Payer: Self-pay | Admitting: Family Medicine

## 2020-05-29 VITALS — BP 122/78 | HR 82 | Temp 97.8°F | Ht 66.0 in | Wt 185.8 lb

## 2020-05-29 DIAGNOSIS — J3089 Other allergic rhinitis: Secondary | ICD-10-CM

## 2020-05-29 DIAGNOSIS — K5900 Constipation, unspecified: Secondary | ICD-10-CM

## 2020-05-29 DIAGNOSIS — Z124 Encounter for screening for malignant neoplasm of cervix: Secondary | ICD-10-CM

## 2020-05-29 DIAGNOSIS — E559 Vitamin D deficiency, unspecified: Secondary | ICD-10-CM

## 2020-05-29 DIAGNOSIS — Z713 Dietary counseling and surveillance: Secondary | ICD-10-CM

## 2020-05-29 MED ORDER — VITAMIN D (ERGOCALCIFEROL) 1.25 MG (50000 UNIT) PO CAPS: 50000.0000 [IU] | ORAL_CAPSULE | ORAL | 0 refills | Status: DC

## 2020-05-29 NOTE — Progress Notes (Signed)
Patient ID: Judy Johnson, female    DOB: 29-Jan-1981, 40 y.o.   MRN: [id number]   Chief Complaint  Patient presents with  . Follow Up   Subjective:    HPI  Pt here for follow up allergies.  Pt states she has recently noticed some bowel changes. Pt goes every 2 days instead of every day. Pt used to go daily BID. Pt is taking stool softenerpills-sometime up to 4 pills a day. Pt would also like referral to get a well woman exam.  Trying to lose weight.  Ketogenic diet eating more meats.  Using stool softner to help with constipation. Using 2 and not working.  Some times 4 and it works.  Walking and running a day and not helping with stool or weight loss.  Not lost much weight 183-185 lbs.  Started diet- Jan 7th. Eating less carbs and more meat.  No bread or sugar.   Seeing derm for alopecia. It's getting better.  Taking vit D and iron and helping.   Medical History Judy Johnson has no past medical history on file.   Outpatient Encounter Medications as of 05/29/2020  Medication Sig  . azelastine (ASTELIN) 0.1 % nasal spray PLACE 2 SPRAYS INTO BOTH NOSTRILS 2 (TWO) TIMES DAILY  . ferrous sulfate (FERROUSUL) 325 (65 FE) MG tablet Take 1 tablet (325 mg total) by mouth daily with breakfast.  . fluticasone (FLONASE) 50 MCG/ACT nasal spray Place 2 sprays into both nostrils daily.  . [DISCONTINUED] Vitamin D, Ergocalciferol, (DRISDOL) 1.25 MG (50000 UNIT) CAPS capsule TAKE 1 CAPSULE (50,000 UNITS TOTAL) BY MOUTH EVERY 7 (SEVEN) DAYS.  Marland Kitchen Vitamin D, Ergocalciferol, (DRISDOL) 1.25 MG (50000 UNIT) CAPS capsule Take 1 capsule (50,000 Units total) by mouth every 7 (seven) days.   No facility-administered encounter medications on file as of 05/29/2020.     Review of Systems  Constitutional: Negative for chills and fever.  HENT: Negative for congestion, rhinorrhea and sore throat.   Respiratory: Negative for cough, shortness of breath and wheezing.   Cardiovascular: Negative  for chest pain and leg swelling.  Gastrointestinal: Positive for constipation. Negative for abdominal pain, diarrhea, nausea and vomiting.  Genitourinary: Negative for dysuria and frequency.  Musculoskeletal: Negative for arthralgias and back pain.  Skin: Negative for rash.  Neurological: Negative for dizziness, weakness and headaches.    Vitals BP 122/78   Pulse 82   Temp 97.8 F (36.6 C)   Ht 5\' 6"  (1.676 m)   Wt 185 lb 12.8 oz (84.3 kg)   SpO2 96%   BMI 29.99 kg/m   Objective:   Physical Exam Vitals and nursing note reviewed.  Constitutional:      Appearance: Normal appearance.  HENT:     Head: Normocephalic and atraumatic.     Nose: Nose normal.     Mouth/Throat:     Mouth: Mucous membranes are moist.     Pharynx: Oropharynx is clear.  Eyes:     Extraocular Movements: Extraocular movements intact.     Conjunctiva/sclera: Conjunctivae normal.     Pupils: Pupils are equal, round, and reactive to light.  Cardiovascular:     Rate and Rhythm: Normal rate and regular rhythm.     Pulses: Normal pulses.     Heart sounds: Normal heart sounds.  Pulmonary:     Effort: Pulmonary effort is normal.     Breath sounds: Normal breath sounds. No wheezing, rhonchi or rales.  Abdominal:     General: Abdomen is flat.  Bowel sounds are normal. There is no distension.     Palpations: Abdomen is soft. There is no mass.     Tenderness: There is no abdominal tenderness. There is no guarding or rebound.     Hernia: No hernia is present.  Musculoskeletal:        General: Normal range of motion.     Right lower leg: No edema.     Left lower leg: No edema.  Skin:    General: Skin is warm and dry.     Findings: No lesion or rash.     Comments: +hair has improved and no longer having bald patches   Neurological:     General: No focal deficit present.     Mental Status: She is alert and oriented to person, place, and time.  Psychiatric:        Mood and Affect: Mood normal.         Behavior: Behavior normal.      Assessment and Plan   1. Constipation, unspecified constipation type  2. Cervical cancer screening - Ambulatory referral to Gynecology  3. Vitamin D deficiency - Vitamin D, Ergocalciferol, (DRISDOL) 1.25 MG (50000 UNIT) CAPS capsule; Take 1 capsule (50,000 Units total) by mouth every 7 (seven) days.  Dispense: 12 capsule; Refill: 0  4. Chronic nonseasonal allergic rhinitis due to fungal spores  5. Weight loss counseling, encounter for   Pt to cont flonase and allergies medications prn for allergies.  Sent referral to gyn for cervical cancer screening.  Constipation- cont to eat high fiber and inc water intake.  Use stool softner prn and may add miralax prn if needed.   Call or rto if not improving.  H/o vit d defic- will recheck on next visit.  Weight gain- pt given advise on dec calories and portions and cont to exercise.  Will recheck next visit.  F/u 66mo or prn.

## 2020-06-15 ENCOUNTER — Other Ambulatory Visit: Payer: Self-pay | Admitting: Family Medicine

## 2020-06-15 DIAGNOSIS — E559 Vitamin D deficiency, unspecified: Secondary | ICD-10-CM

## 2020-07-07 ENCOUNTER — Other Ambulatory Visit: Payer: Self-pay | Admitting: Family Medicine

## 2020-07-24 ENCOUNTER — Encounter: Payer: Self-pay | Admitting: Advanced Practice Midwife

## 2020-07-24 ENCOUNTER — Other Ambulatory Visit (HOSPITAL_COMMUNITY)
Admission: RE | Admit: 2020-07-24 | Discharge: 2020-07-24 | Disposition: A | Discharge status: Home / Self Care | Payer: 59 | Source: Ambulatory Visit | Attending: Advanced Practice Midwife | Admitting: Advanced Practice Midwife

## 2020-07-24 ENCOUNTER — Other Ambulatory Visit: Payer: Self-pay

## 2020-07-24 ENCOUNTER — Ambulatory Visit (INDEPENDENT_AMBULATORY_CARE_PROVIDER_SITE_OTHER): Payer: 59 | Admitting: Advanced Practice Midwife

## 2020-07-24 VITALS — BP 110/68 | HR 51 | Ht 66.0 in | Wt 180.0 lb

## 2020-07-24 DIAGNOSIS — Z01419 Encounter for gynecological examination (general) (routine) without abnormal findings: Secondary | ICD-10-CM | POA: Diagnosis not present

## 2020-07-24 DIAGNOSIS — R87810 Cervical high risk human papillomavirus (HPV) DNA test positive: Secondary | ICD-10-CM

## 2020-07-24 NOTE — Patient Instructions (Addendum)
HPV (Human Papillomavirus) Vaccine: What You Need to Know 1. Why get vaccinated? HPV (human papillomavirus) vaccine can prevent infection with some types of human papillomavirus. HPV infections can cause certain types of cancers, including:  cervical, vaginal, and vulvar cancers in women  penile cancer in men  anal cancers in both men and women  cancers of tonsils, base of tongue, and back of throat (oropharyngeal cancer) in both men and women HPV infections can also cause anogenital warts. HPV vaccine can prevent over 90% of cancers caused by HPV. HPV is spread through intimate skin-to-skin or sexual contact. HPV infections are so common that nearly all people will get at least one type of HPV at some time in their lives. Most HPV infections go away on their own within 2 years. But sometimes HPV infections will last longer and can cause cancers later in life. 2. HPV vaccine HPV vaccine is routinely recommended for adolescents at 86 or 39 years of age to ensure they are protected before they are exposed to the virus. HPV vaccine may be given beginning at age 63 years and vaccination is recommended for everyone through 39 years of age. HPV vaccine may be given to adults 59 through 39 years of age, based on discussions between the patient and health care provider. Most children who get the first dose before 82 years of age need 2 doses of HPV vaccine. People who get the first dose at or after 74 years of age and younger people with certain immunocompromising conditions need 3 doses. Your health care provider can give you more information. HPV vaccine may be given at the same time as other vaccines. 3. Talk with your health care provider Tell your vaccination provider if the person getting the vaccine:  Has had an allergic reaction after a previous dose of HPV vaccine, or has any severe, life-threatening allergies  Is pregnant--HPV vaccine is not recommended until after pregnancy In some cases,  your health care provider may decide to postpone HPV vaccination until a future visit. People with minor illnesses, such as a cold, may be vaccinated. People who are moderately or severely ill should usually wait until they recover before getting HPV vaccine. Your health care provider can give you more information. 4. Risks of a vaccine reaction  Soreness, redness, or swelling where the shot is given can happen after HPV vaccination.  Fever or headache can happen after HPV vaccination. People sometimes faint after medical procedures, including vaccination. Tell your provider if you feel dizzy or have vision changes or ringing in the ears. As with any medicine, there is a very remote chance of a vaccine causing a severe allergic reaction, other serious injury, or death. 5. What if there is a serious problem? An allergic reaction could occur after the vaccinated person leaves the clinic. If you see signs of a severe allergic reaction (hives, swelling of the face and throat, difficulty breathing, a fast heartbeat, dizziness, or weakness), call 9-1-1 and get the person to the nearest hospital. For other signs that concern you, call your health care provider. Adverse reactions should be reported to the Vaccine Adverse Event Reporting System (VAERS). Your health care provider will usually file this report, or you can do it yourself. Visit the VAERS website at www.vaers.SamedayNews.es or call 724-079-0441. VAERS is only for reporting reactions, and VAERS staff members do not give medical advice. 6. The National Vaccine Injury Compensation Program The [employer] Vaccine Injury Compensation Program (VICP) is a Technical brewer that was created  to compensate people who may have been injured by certain vaccines. Claims regarding alleged injury or death due to vaccination have a time limit for filing, which may be as short as two years. Visit the VICP website at SpiritualWord.at or call 646-500-1071 to  learn about the program and about filing a claim. 7. How can I learn more?  Ask your health care provider.  Call your local or state health department.  Visit the website of the Food and Drug Administration (FDA) for vaccine package inserts and additional information at FinderList.no.  Contact the Centers for Disease Control and Prevention (CDC): ? Call 941-271-3720 (1-800-CDC-INFO) or ? Visit CDC's website at PicCapture.uy. Vaccine Information Statement HPV Vaccine (11/23/2019) This information is not intended to replace advice given to you by your health care provider. Make sure you discuss any questions you have with your health care provider. Document Revised: 01/01/2020 Document Reviewed: 01/01/2020 Elsevier Patient Education  2021 [employer].   If you want this vaccine, contact you primary care provider.

## 2020-07-24 NOTE — Progress Notes (Signed)
Judy Johnson 40 y.o.  Vitals:   07/24/20 1053  BP: 110/68  Pulse: (!) 51     Filed Weights   07/24/20 1053  Weight: 180 lb (81.6 kg)    Past Medical History: History reviewed. No pertinent past medical history.  Past Surgical History: Past Surgical History:  Procedure Laterality Date  . NASAL SINUS SURGERY      Family History: Family History  Problem Relation Age of Onset  . Cervical cancer Mother   . Asthma Neg Hx   . Allergic rhinitis Neg Hx   . Angioedema Neg Hx   . Atopy Neg Hx   . Eczema Neg Hx   . Immunodeficiency Neg Hx   . Urticaria Neg Hx     Social History: Social History   Tobacco Use  . Smoking status: Never Smoker  . Smokeless tobacco: Never Used  Vaping Use  . Vaping Use: Never used  Substance Use Topics  . Alcohol use: No  . Drug use: No    Allergies:  Allergies  Allergen Reactions  . Cefzil [Cefprozil]     rash      Current Outpatient Medications:  .  azelastine (ASTELIN) 0.1 % nasal spray, PLACE 2 SPRAYS INTO BOTH NOSTRILS 2 (TWO) TIMES DAILY, Disp: 30 mL, Rfl: 2 .  ferrous sulfate (FERROUSUL) 325 (65 FE) MG tablet, Take 1 tablet (325 mg total) by mouth daily with breakfast., Disp: 30 tablet, Rfl: 3 .  fluticasone (FLONASE) 50 MCG/ACT nasal spray, Place 2 sprays into both nostrils daily., Disp: 16 g, Rfl: 5 .  Vitamin D, Ergocalciferol, (DRISDOL) 1.25 MG (50000 UNIT) CAPS capsule, TAKE 1 CAPSULE (50,000 UNITS TOTAL) BY MOUTH EVERY 7 (SEVEN) DAYS, Disp: 12 capsule, Rfl: 1  History of Present Illness: Here for pap and physical. Last pap 2018, ASCUS w/HR HPV (not typed)   Review of Systems   Patient denies any headaches, blurred vision, shortness of breath, chest pain, abdominal pain, problems with urination or intercourse. Hx of constipation, doing better w/stool softeners  Physical Exam: General:  Well developed, well nourished, no acute distress Skin:  Warm and dry Neck:  Midline trachea, normal thyroid Lungs; Clear to  auscultation bilaterally Breast:  No dominant palpable mass, retraction, or nipple discharge Cardiovascular: Regular rate and rhythm Abdomen:  Soft, non tender, no hepatosplenomegaly Pelvic:  External genitalia is normal in appearance.  The vagina is normal in appearance.  The cervix is bulbous.  Uterus is felt to be normal size, shape, and contour.  No adnexal masses or tenderness noted.  Extremities:  No swelling or varicosities noted Psych:  No mood changes.     Impression: Normal GYN exam     Plan: If pap normal, [facility]P recommends 1 year follow up due to hx HPV in 2018

## 2020-07-29 LAB — CYTOLOGY - PAP
Chlamydia: NEGATIVE
Comment: NEGATIVE
Comment: NEGATIVE
Comment: NORMAL
Diagnosis: NEGATIVE
High risk HPV: NEGATIVE
Neisseria Gonorrhea: NEGATIVE

## 2020-10-28 ENCOUNTER — Other Ambulatory Visit: Payer: Self-pay | Admitting: Family Medicine

## 2020-11-28 ENCOUNTER — Ambulatory Visit: Payer: 59 | Admitting: Family Medicine

## 2021-01-17 ENCOUNTER — Other Ambulatory Visit: Payer: Self-pay | Admitting: Family Medicine

## 2021-01-17 DIAGNOSIS — E559 Vitamin D deficiency, unspecified: Secondary | ICD-10-CM

## 2021-04-03 ENCOUNTER — Other Ambulatory Visit: Payer: Self-pay | Admitting: Family Medicine

## 2021-04-06 NOTE — Telephone Encounter (Signed)
Sent message 04/06/21

## 2021-05-14 NOTE — Telephone Encounter (Signed)
Left message to schedule appointment 05/14/21

## 2021-05-19 NOTE — Telephone Encounter (Signed)
Called and sent two messages no response 05/19/21

## 2021-07-08 ENCOUNTER — Encounter: Payer: Self-pay | Admitting: Nurse Practitioner

## 2021-07-08 ENCOUNTER — Ambulatory Visit: Payer: 59 | Admitting: Nurse Practitioner

## 2021-07-08 ENCOUNTER — Other Ambulatory Visit: Payer: Self-pay

## 2021-07-08 VITALS — BP 122/70 | HR 87 | Temp 98.2°F | Wt 185.8 lb

## 2021-07-08 DIAGNOSIS — K219 Gastro-esophageal reflux disease without esophagitis: Secondary | ICD-10-CM | POA: Diagnosis not present

## 2021-07-08 DIAGNOSIS — Z13220 Encounter for screening for lipoid disorders: Secondary | ICD-10-CM

## 2021-07-08 DIAGNOSIS — Z862 Personal history of diseases of the blood and blood-forming organs and certain disorders involving the immune mechanism: Secondary | ICD-10-CM | POA: Diagnosis not present

## 2021-07-08 DIAGNOSIS — J302 Other seasonal allergic rhinitis: Secondary | ICD-10-CM

## 2021-07-08 DIAGNOSIS — E559 Vitamin D deficiency, unspecified: Secondary | ICD-10-CM

## 2021-07-08 MED ORDER — FEXOFENADINE HCL 60 MG PO TABS: 60.0000 mg | ORAL_TABLET | Freq: Two times a day (BID) | ORAL | 3 refills | Status: DC

## 2021-07-08 MED ORDER — FLUTICASONE PROPIONATE 50 MCG/ACT NA SUSP: 2.0000 | Freq: Every day | NASAL | 6 refills | Status: DC

## 2021-07-08 MED ORDER — PANTOPRAZOLE SODIUM 40 MG PO TBEC: 40.0000 mg | DELAYED_RELEASE_TABLET | Freq: Every day | ORAL | 3 refills | Status: DC

## 2021-07-08 NOTE — Progress Notes (Signed)
? ?Subjective:  ? ? Patient ID: Judy Johnson, female    DOB: March 15, 1981, 41 y.o.   MRN: [id number] ? ?HPI ? ?41 year old female patient presents to the clinic with husband with complaints of upset stomach, burning, cramping, nausea, belching, and gas for about a 1 week.  Patient states that pain is worse after eating spicy foods.  Husband states that he also notices increased gastric pain when patient is stressed.  Patient states that the pain is worse when she is stressed and at night.  Patient denies any vomiting, constipation diarrhea, blood in stool, blood in vomit, fevers, chills, body aches, regurgitation.   ? ?Patient also has complaint of seasonal allergies runny nose and postnasal drip.  Patient currently taking azelastine and Flonase for allergy symptoms which the patient states helps. ? ? ?Review of Systems  ?Gastrointestinal:  Positive for abdominal pain and nausea.  ?All other systems reviewed and are negative. ? ?   ?Objective:  ? Physical Exam ?Constitutional:   ?   General: She is not in acute distress. ?   Appearance: Normal appearance. She is normal weight. She is not ill-appearing or toxic-appearing.  ?Cardiovascular:  ?   Rate and Rhythm: Normal rate and regular rhythm.  ?   Pulses: Normal pulses.  ?   Heart sounds: Normal heart sounds.  ?Pulmonary:  ?   Effort: Pulmonary effort is normal. No respiratory distress.  ?   Breath sounds: Normal breath sounds. No wheezing.  ?Abdominal:  ?   General: Abdomen is flat. Bowel sounds are normal. There is no distension.  ?   Palpations: Abdomen is soft. There is no mass.  ?   Tenderness: There is abdominal tenderness in the epigastric area and periumbilical area. There is no guarding or rebound. Negative signs include Murphy's sign, Rovsing's sign and McBurney's sign.  ?   Hernia: No hernia is present.  ?Musculoskeletal:  ?   Comments: Grossly intact  ?Skin: ?   General: Skin is warm.  ?Neurological:  ?   Mental Status: She is alert.  ?   Comments:  Grossly intact  ?Psychiatric:     ?   Mood and Affect: Mood normal.     ?   Behavior: Behavior normal.  ? ? ?   ?Assessment & Plan:  ? ?1. Gastroesophageal reflux disease, unspecified whether esophagitis present ?-Likely GERD. ?-We will plan to treat with Protonix for 90 days. ?-If symptoms not better with Protonix will refer patient to GI for EGD. ?-Patient encouraged to avoid triggers such as spicy foods, caffeine, acidic foods and drinks, chocolates.  Patient stated understanding ?- pantoprazole (PROTONIX) 40 MG tablet; Take 1 tablet (40 mg total) by mouth daily.  Dispense: 30 tablet; Refill: 3 ?- CBC with Differential/Platelet ?- CMP14+EGFR ?-Return to clinic in 1 week for annual exam and stomach pain follow-up.  Remember to bring updated immunization. ? ?2. Seasonal allergies ?Asencion Islam and Allegra sent into patient's pharmacy of choice.  Discussed that insurance may or may not pay for medication.  Patient and husband both stated understanding. ?- fluticasone (FLONASE) 50 MCG/ACT nasal spray; Place 2 sprays into both nostrils daily.  Dispense: 16 g; Refill: 6 ?- fexofenadine (ALLEGRA ALLERGY) 60 MG tablet; Take 1 tablet (60 mg total) by mouth 2 (two) times daily.  Dispense: 60 tablet; Refill: 3 ? ?3. Vitamin D deficiency ?-Last vitamin D was 17.  We will recheck vitamin D and reorder vitamin D supplements as necessary. ?- Vitamin D (25 hydroxy) ?-Follow-up in 4  weeks for annual exam ? ?4. History of anemia ?-Patient has history of iron deficiency anemia.  Will reassess CBC today. ?- CBC with Differential/Platelet ?-Follow-up in 4 weeks for annual exam ? ?5. Lipid screening ?-Patient has history of hyperlipidemia.  ?-Last LDL was 148. ?-We will recheck lipids today. ?- Lipid Profile ?-Follow-up in 4 weeks for annual exam ? ?  ?Note:  This document was prepared using Dragon voice recognition software and may include unintentional dictation errors. ? ? ?

## 2021-07-09 ENCOUNTER — Other Ambulatory Visit: Payer: Self-pay | Admitting: Nurse Practitioner

## 2021-07-09 LAB — CBC WITH DIFFERENTIAL/PLATELET
Basophils Absolute: 0 10*3/uL (ref 0.0–0.2)
Basos: 0 %
EOS (ABSOLUTE): 0.4 10*3/uL (ref 0.0–0.4)
Eos: 6 %
Hematocrit: 41.2 % (ref 34.0–46.6)
Hemoglobin: 13.7 g/dL (ref 11.1–15.9)
Immature Grans (Abs): 0 10*3/uL (ref 0.0–0.1)
Immature Granulocytes: 0 %
Lymphocytes Absolute: 2 10*3/uL (ref 0.7–3.1)
Lymphs: 28 %
MCH: 30.2 pg (ref 26.6–33.0)
MCHC: 33.3 g/dL (ref 31.5–35.7)
MCV: 91 fL (ref 79–97)
Monocytes Absolute: 0.4 10*3/uL (ref 0.1–0.9)
Monocytes: 6 %
Neutrophils Absolute: 4.3 10*3/uL (ref 1.4–7.0)
Neutrophils: 60 %
Platelets: 295 10*3/uL (ref 150–450)
RBC: 4.53 x10E6/uL (ref 3.77–5.28)
RDW: 12.7 % (ref 11.7–15.4)
WBC: 7.1 10*3/uL (ref 3.4–10.8)

## 2021-07-09 LAB — CMP14+EGFR
ALT: 15 IU/L (ref 0–32)
AST: 12 IU/L (ref 0–40)
Albumin/Globulin Ratio: 1.5 (ref 1.2–2.2)
Albumin: 4.5 g/dL (ref 3.8–4.8)
Alkaline Phosphatase: 50 IU/L (ref 44–121)
BUN/Creatinine Ratio: 12 (ref 9–23)
BUN: 10 mg/dL (ref 6–20)
Bilirubin Total: 0.3 mg/dL (ref 0.0–1.2)
CO2: 22 mmol/L (ref 20–29)
Calcium: 9.6 mg/dL (ref 8.7–10.2)
Chloride: 103 mmol/L (ref 96–106)
Creatinine, Ser: 0.86 mg/dL (ref 0.57–1.00)
Globulin, Total: 3 g/dL (ref 1.5–4.5)
Glucose: 94 mg/dL (ref 70–99)
Potassium: 4.3 mmol/L (ref 3.5–5.2)
Sodium: 140 mmol/L (ref 134–144)
Total Protein: 7.5 g/dL (ref 6.0–8.5)
eGFR: 88 mL/min/{1.73_m2} (ref >59)

## 2021-07-09 LAB — LIPID PANEL
Chol/HDL Ratio: 6.1 ratio — ABNORMAL HIGH (ref 0.0–4.4)
Cholesterol, Total: 219 mg/dL — ABNORMAL HIGH (ref 100–199)
HDL: 36 mg/dL — ABNORMAL LOW (ref >39)
LDL Chol Calc (NIH): 149 mg/dL — ABNORMAL HIGH (ref 0–99)
Triglycerides: 186 mg/dL — ABNORMAL HIGH (ref 0–149)
VLDL Cholesterol Cal: 34 mg/dL (ref 5–40)

## 2021-07-09 LAB — VITAMIN D 25 HYDROXY (VIT D DEFICIENCY, FRACTURES): Vit D, 25-Hydroxy: 27 ng/mL — ABNORMAL LOW (ref 30.0–100.0)

## 2021-07-09 MED ORDER — VITAMIN D3 25 MCG (1000 UT) PO CAPS
1000.0000 [IU] | ORAL_CAPSULE | Freq: Every day | ORAL | 0 refills | Status: DC
Start: 2021-07-09 — End: 2021-08-06

## 2021-07-31 ENCOUNTER — Other Ambulatory Visit: Payer: Self-pay | Admitting: Nurse Practitioner

## 2021-08-06 ENCOUNTER — Encounter: Payer: Self-pay | Admitting: Nurse Practitioner

## 2021-08-06 ENCOUNTER — Ambulatory Visit (INDEPENDENT_AMBULATORY_CARE_PROVIDER_SITE_OTHER): Payer: 59 | Admitting: Nurse Practitioner

## 2021-08-06 VITALS — BP 109/74 | HR 68 | Temp 98.2°F | Ht 66.0 in | Wt 179.0 lb

## 2021-08-06 DIAGNOSIS — K219 Gastro-esophageal reflux disease without esophagitis: Secondary | ICD-10-CM | POA: Insufficient documentation

## 2021-08-06 DIAGNOSIS — E782 Mixed hyperlipidemia: Secondary | ICD-10-CM

## 2021-08-06 DIAGNOSIS — E559 Vitamin D deficiency, unspecified: Secondary | ICD-10-CM

## 2021-08-06 DIAGNOSIS — Z0000 Encounter for general adult medical examination without abnormal findings: Secondary | ICD-10-CM

## 2021-08-06 NOTE — Progress Notes (Signed)
? ?Subjective:  ? ? Patient ID: Judy Johnson, female    DOB: 1980/05/30, 41 y.o.   MRN: [id number] ? ?HPI ? ?The patient comes in today for a wellness visit. ? ?A review of their health history was completed. ? A review of medications was also completed. ? ?Any needed refills; Vitamin D ? ?Eating habits: Good, but GERD aggravated by spicy foods.  But patient states that she has a very well diet and is excited that she has lost 6 pounds. ? ?Falls/  MVA accidents in past few months: 1 fall while chasing a dog in the ice.  Patient states that she fell on her bottom is still occasionally has pain that is relieved by standing up ? ?Regular exercise: Patient has a garden and gets plenty of physical activity working in the garden. ? ?Specialist pt sees on regular basis: None ? ?Preventative health issues were discussed.  ? ?Need immunization records ? ?Additional concerns:  ? ?Labs reviewed with patient ? ?Patient states that GERD is getting better.  Patient identifies that GERD is triggered by spicy foods.  Patient states that eating oatmeal helps.  Patient states that she will occasionally feel GERD symptoms lying down at night. ? ? ?Review of Systems  ?Gastrointestinal:   ?     Acid reflux  ?All other systems reviewed and are negative. ? ?   ?Objective:  ? Physical Exam ?Vitals reviewed.  ?Constitutional:   ?   General: She is not in acute distress. ?   Appearance: Normal appearance. She is normal weight. She is not ill-appearing, toxic-appearing or diaphoretic.  ?Cardiovascular:  ?   Rate and Rhythm: Normal rate and regular rhythm.  ?   Pulses: Normal pulses.  ?   Heart sounds: Normal heart sounds. No murmur heard. ?Pulmonary:  ?   Effort: Pulmonary effort is normal.  ?   Breath sounds: Normal breath sounds.  ?Abdominal:  ?   General: Abdomen is flat. Bowel sounds are normal. There is no distension.  ?   Palpations: Abdomen is soft. There is no mass.  ?   Tenderness: There is no abdominal tenderness. There is no  right CVA tenderness, left CVA tenderness, guarding or rebound.  ?   Hernia: No hernia is present.  ?Genitourinary: ?   Comments: Vaginal exam deferred today ?Musculoskeletal:  ?   Comments: Grossly intact  ?Skin: ?   General: Skin is warm.  ?   Capillary Refill: Capillary refill takes less than 2 seconds.  ?Neurological:  ?   Mental Status: She is alert.  ?   Comments: Grossly intact  ?Psychiatric:     ?   Mood and Affect: Mood normal.     ?   Behavior: Behavior normal.  ? ? ? ? ? ?   ?Assessment & Plan:  ? ?1. Wellness examination ?Adult wellness-complete.wellness physical was conducted today. Importance of diet and exercise were discussed in detail.  ?In addition to this a discussion regarding safety was also covered. We also reviewed over immunizations and gave recommendations regarding current immunization needed for age.  ?In addition to this additional areas were also touched on including: ?Preventative health exams needed: ? ?Colonoscopy not indicated ?Mammogram not indicated yet. Patient turns 41 in October ?Eye exam, patient to schedule ?Dental exam current ?Patient to bring in copy of immunization ? ?Patient was advised yearly wellness exam  ? ?2. Gastroesophageal reflux disease, unspecified whether esophagitis present ?-Patient to continue taking Protonix for symptom management ?-Try to eat  smaller meals ?-Continue to avoid spicy foods and other trigger foods ?-Try to avoid late night eating ?-Referral to GI for EGD offered however patient states that she will continue to monitor her symptoms and if symptoms persist despite lifestyle changes we will move forward with referral to GI ?-Patient return to clinic in a year for annual exam or sooner if needed ? ?3. Mixed hyperlipidemia ?-LDL 149.  Goal LDL of less than 100 met ?-Encouraged to continue eating healthy diet and increasing physical activity ?-Statin not indicated at this time however patient notified that when she turns 41 a statin may be  recommended to manage cholesterol.  Patient stated understanding ? ?4. Vitamin D deficiency ?-Patient to return to clinic in 4 to 6 weeks to have vitamin D and redraw ?-Continue taking vitamin D 1000 units for additional 30 days. ?- Vitamin D (25 hydroxy) ?  ?Note:  This document was prepared using Dragon voice recognition software and may include unintentional dictation errors. ? ?

## 2021-09-05 ENCOUNTER — Other Ambulatory Visit: Payer: Self-pay | Admitting: Nurse Practitioner

## 2021-10-11 ENCOUNTER — Other Ambulatory Visit: Payer: Self-pay | Admitting: Nurse Practitioner

## 2021-11-08 ENCOUNTER — Other Ambulatory Visit: Payer: Self-pay | Admitting: Nurse Practitioner

## 2022-03-25 ENCOUNTER — Ambulatory Visit
Admission: EM | Admit: 2022-03-25 | Discharge: 2022-03-25 | Disposition: A | Discharge status: Home / Self Care | Payer: 59 | Attending: Family Medicine | Admitting: Family Medicine

## 2022-03-25 DIAGNOSIS — R590 Localized enlarged lymph nodes: Secondary | ICD-10-CM | POA: Diagnosis not present

## 2022-03-25 MED ORDER — PREDNISONE 20 MG PO TABS: 40.0000 mg | ORAL_TABLET | Freq: Every day | ORAL | 0 refills | Status: DC

## 2022-03-25 NOTE — ED Provider Notes (Signed)
[facility]    CSN: [id number] Arrival date & time: 03/25/22  1131      History   Chief Complaint No chief complaint on file.   HPI Judy Johnson is a 41 y.o. female.   Patient presenting today with swollen painful lymph node to the right side of neck for the past 4 days.  Denies fever, chills, body aches, sweats, sore throat, ear pain, cough, congestion beyond her typical allergy symptoms.  She does have a history of significant seasonal allergies on consistent regimen for this.  No known sick contacts recently.  Not trying anything over-the-counter for symptoms.    History reviewed. No pertinent past medical history.  Patient Active Problem List   Diagnosis Date Noted   Vitamin D deficiency 08/06/2021   Mixed hyperlipidemia 08/06/2021   Gastroesophageal reflux disease 08/06/2021   Chronic nonseasonal allergic rhinitis due to fungal spores 03/16/2016   LGSIL on Pap smear of cervix 02/06/2016   Cervical high risk HPV (human papillomavirus) test positive 02/06/2016    Past Surgical History:  Procedure Laterality Date   NASAL SINUS SURGERY      OB History     Gravida  1   Para      Term      Preterm      AB      Living  1      SAB      IAB      Ectopic      Multiple      Live Births  1            Home Medications    Prior to Admission medications   Medication Sig Start Date End Date Taking? Authorizing Provider  cholecalciferol (VITAMIN D) 25 MCG (1000 UNIT) tablet TAKE 1 TABLET BY MOUTH EVERY DAY 11/09/21   Kathyrn Drown, MD  predniSONE (DELTASONE) 20 MG tablet Take 2 tablets (40 mg total) by mouth daily with breakfast. 03/25/22  Yes Volney American, PA-C  Azelastine HCl 137 MCG/SPRAY SOLN PLACE 2 SPRAYS INTO BOTH NOSTRILS 2 (TWO) TIMES DAILY 10/28/20   Elvia Collum M, DO  ferrous sulfate (FERROUSUL) 325 (65 FE) MG tablet Take 1 tablet (325 mg total) by mouth daily with breakfast. 11/30/19   Elvia Collum M, DO   fexofenadine (ALLEGRA ALLERGY) 60 MG tablet Take 1 tablet (60 mg total) by mouth 2 (two) times daily. 07/08/21   [employer], Trenton Gammon, FNP  fluticasone (FLONASE) 50 MCG/ACT nasal spray Place 2 sprays into both nostrils daily. 07/08/21   [employer], Trenton Gammon, FNP  pantoprazole (PROTONIX) 40 MG tablet Take 1 tablet (40 mg total) by mouth daily. 07/08/21   [employer], Trenton Gammon, FNP  Vitamin D, Ergocalciferol, (DRISDOL) 1.25 MG (50000 UNIT) CAPS capsule TAKE 1 CAPSULE (50,000 UNITS TOTAL) BY MOUTH EVERY 7 (SEVEN) DAYS 06/16/20   Erven Colla, DO    Family History Family History  Problem Relation Age of Onset   Cervical cancer Mother    Asthma Neg Hx    Allergic rhinitis Neg Hx    Angioedema Neg Hx    Atopy Neg Hx    Eczema Neg Hx    Immunodeficiency Neg Hx    Urticaria Neg Hx     Social History Social History   Tobacco Use   Smoking status: Never   Smokeless tobacco: Never  Vaping Use   Vaping Use: Never used  Substance Use Topics   Alcohol use: No   Drug use: No  Allergies   Cefzil [cefprozil]   Review of Systems Review of Systems Per HPI  Physical Exam Triage Vital Signs ED Triage Vitals  Enc Vitals Group     BP 03/25/22 1140 106/70     Pulse Rate 03/25/22 1140 71     Resp 03/25/22 1140 20     Temp 03/25/22 1140 (!) 97.4 F (36.3 C)     Temp Source 03/25/22 1140 Oral     SpO2 03/25/22 1140 97 %     Weight --      Height --      Head Circumference --      Peak Flow --      Pain Score 03/25/22 1142 4     Pain Loc --      Pain Edu? --      Excl. in [place]? --    No data found.  Updated Vital Signs BP 106/70 (BP Location: Right Arm)   Pulse 71   Temp (!) 97.4 F (36.3 C) (Oral)   Resp 20   LMP 02/28/2022 (Exact Date)   SpO2 97%   Visual Acuity Right Eye Distance:   Left Eye Distance:   Bilateral Distance:    Right Eye Near:   Left Eye Near:    Bilateral Near:     Physical Exam Vitals and nursing note reviewed.  Constitutional:      Appearance:  Normal appearance. She is not ill-appearing.  HENT:     Head: Atraumatic.     Right Ear: Tympanic membrane normal.     Left Ear: Tympanic membrane normal.     Nose: Nose normal.     Mouth/Throat:     Mouth: Mucous membranes are moist.     Pharynx: Posterior oropharyngeal erythema present. No oropharyngeal exudate.  Eyes:     Extraocular Movements: Extraocular movements intact.     Conjunctiva/sclera: Conjunctivae normal.  Neck:     Comments: Focal right anterior cervical adenopathy Cardiovascular:     Rate and Rhythm: Normal rate and regular rhythm.     Heart sounds: Normal heart sounds.  Pulmonary:     Effort: Pulmonary effort is normal.     Breath sounds: Normal breath sounds. No wheezing or rales.  Musculoskeletal:        General: Normal range of motion.     Cervical back: Normal range of motion and neck supple.  Lymphadenopathy:     Cervical: Cervical adenopathy present.  Skin:    General: Skin is warm and dry.  Neurological:     Mental Status: She is alert and oriented to person, place, and time.  Psychiatric:        Mood and Affect: Mood normal.        Thought Content: Thought content normal.        Judgment: Judgment normal.      UC Treatments / Results  Labs (all labs ordered are listed, but only abnormal results are displayed) Labs Reviewed - No data to display  EKG   Radiology No results found.  Procedures Procedures (including critical care time)  Medications Ordered in UC Medications - No data to display  Initial Impression / Assessment and Plan / UC Course  I have reviewed the triage vital signs and the nursing notes.  Pertinent labs & imaging results that were available during my care of the patient were reviewed by me and considered in my medical decision making (see chart for details).     Suspect reactive lymph node, likely  secondary to seasonal allergy exacerbation.  Treat with very short course of prednisone, ibuprofen, warm compresses,  double up on allergy regimen for the time being.  No bacterial cause identified today.  Return for worsening symptoms.  Final Clinical Impressions(s) / UC Diagnoses   Final diagnoses:  Reactive cervical lymphadenopathy     Discharge Instructions      Double up on your Allegra for the next week or so, be consistent with Flonase twice daily, ibuprofen for pain and swelling, warm compresses to the sore area of your neck and I have also sent prednisone to see if this helps resolve symptoms faster.  Follow-up for worsening symptoms.    ED Prescriptions     Medication Sig Dispense Auth. Provider   predniSONE (DELTASONE) 20 MG tablet Take 2 tablets (40 mg total) by mouth daily with breakfast. 6 tablet Particia Nearing, [place]      PDMP not reviewed this encounter.   Particia Nearing, [place] 03/25/22 1239

## 2022-03-25 NOTE — Discharge Instructions (Signed)
Double up on your Allegra for the next week or so, be consistent with Flonase twice daily, ibuprofen for pain and swelling, warm compresses to the sore area of your neck and I have also sent prednisone to see if this helps resolve symptoms faster.  Follow-up for worsening symptoms.

## 2022-03-25 NOTE — ED Triage Notes (Signed)
Pt reports she has a "swollen gland" on the right side of her throat on the exterior  part. And her face is a bit swollen too. Symptoms start 4 days ago. She can't open her mouth wide without it really hurting. Pt didn't take any meds or apply any compresses. Says the pain is minimal but its uncomfortable

## 2022-08-12 ENCOUNTER — Encounter: Payer: Self-pay | Admitting: Nurse Practitioner

## 2022-09-23 ENCOUNTER — Other Ambulatory Visit: Payer: Self-pay | Admitting: Family Medicine

## 2022-09-23 NOTE — Telephone Encounter (Signed)
She needs to sched ov with dr Adriana Simas

## 2022-10-26 ENCOUNTER — Other Ambulatory Visit: Payer: Self-pay | Admitting: Family Medicine

## 2022-10-30 ENCOUNTER — Other Ambulatory Visit: Payer: Self-pay | Admitting: Family Medicine

## 2024-03-20 ENCOUNTER — Encounter: Payer: Self-pay | Admitting: Nurse Practitioner

## 2024-03-20 ENCOUNTER — Telehealth: Admitting: Physician Assistant

## 2024-03-20 DIAGNOSIS — L649 Androgenic alopecia, unspecified: Secondary | ICD-10-CM

## 2024-03-20 MED ORDER — MINOXIDIL FOR WOMEN 5 % EX FOAM: CUTANEOUS | 0 refills | Status: AC

## 2024-03-20 NOTE — Patient Instructions (Signed)
  Babs Kirks, thank you for joining [name], PA-C for today's virtual visit.  While this provider is not your primary care provider (PCP), if your PCP is located in our provider database this encounter information will be shared with them immediately following your visit.   A [facility] MyChart account gives you access to today's visit and all your visits, tests, and labs performed at [facility]  click here if you don't have a [facility] MyChart account or go to [email or web address]  Consent: (Patient) Producer, Television/film/video provided verbal consent for this virtual visit at the beginning of the encounter.  Current Medications:  Current Outpatient Medications:    Azelastine  HCl 137 MCG/SPRAY SOLN, PLACE 2 SPRAYS INTO BOTH NOSTRILS 2 (TWO) TIMES DAILY, Disp: 30 mL, Rfl: 5   cholecalciferol (VITAMIN D3) 25 MCG (1000 UNIT) tablet, TAKE 1 TABLET BY MOUTH EVERY DAY, Disp: 30 tablet, Rfl: 0   ferrous sulfate  (FERROUSUL) 325 (65 FE) MG tablet, Take 1 tablet (325 mg total) by mouth daily with breakfast., Disp: 30 tablet, Rfl: 3   fexofenadine  (ALLEGRA  ALLERGY) 60 MG tablet, Take 1 tablet (60 mg total) by mouth 2 (two) times daily., Disp: 60 tablet, Rfl: 3   fluticasone  (FLONASE ) 50 MCG/ACT nasal spray, Place 2 sprays into both nostrils daily., Disp: 16 g, Rfl: 6   pantoprazole  (PROTONIX ) 40 MG tablet, Take 1 tablet (40 mg total) by mouth daily., Disp: 30 tablet, Rfl: 3   predniSONE  (DELTASONE ) 20 MG tablet, Take 2 tablets (40 mg total) by mouth daily with breakfast., Disp: 6 tablet, Rfl: 0   Vitamin D , Ergocalciferol , (DRISDOL ) 1.25 MG (50000 UNIT) CAPS capsule, TAKE 1 CAPSULE (50,000 UNITS TOTAL) BY MOUTH EVERY 7 (SEVEN) DAYS, Disp: 12 capsule, Rfl: 1   Medications ordered in this encounter:  No orders of the defined types were placed in this encounter.    *If you need refills on other medications prior to your next appointment, please contact your  pharmacy*  Follow-Up: Call back or seek an in-person evaluation if the symptoms worsen or if the condition fails to improve as anticipated.  [facility] [phone]  Other Instructions Please start the topical medication as directed. Keep the scalp clean and dry. Avoid pulling the hair into positions of tension (tight ponytail's, etc.). Continue vitamin supplementation. You will be contacted by our virtual primary care for ongoing evaluation.   If you have been instructed to have an in-person evaluation today at a local Urgent Care facility, please use the link below. It will take you to a list of all of our available [facility], including address, phone number and hours of operation. Please do not delay care.  [facility]  If you or a family member do not have a primary care provider, use the link below to schedule a visit and establish care. When you choose a [facility] primary care physician or advanced practice provider, you gain a long-term partner in health. Find a Primary Care Provider  Learn more about [facility]'s in-office and virtual care options: [facility] - Get Care Now

## 2024-03-20 NOTE — Progress Notes (Signed)
 Virtual Visit Consent   Judy Johnson, you are scheduled for a virtual visit with a [facility] provider today. Just as with appointments in the office, your consent must be obtained to participate. Your consent will be active for this visit and any virtual visit you may have with one of our providers in the next 365 days. If you have a MyChart account, a copy of this consent can be sent to you electronically.  As this is a virtual visit, video technology does not allow for your provider to perform a traditional examination. This may limit your provider's ability to fully assess your condition. If your provider identifies any concerns that need to be evaluated in person or the need to arrange testing (such as labs, EKG, etc.), we will make arrangements to do so. Although advances in technology are sophisticated, we cannot ensure that it will always work on either your end or our end. If the connection with a video visit is poor, the visit may have to be switched to a telephone visit. With either a video or telephone visit, we are not always able to ensure that we have a secure connection.  By engaging in this virtual visit, you consent to the provision of healthcare and authorize for your insurance to be billed (if applicable) for the services provided during this visit. Depending on your insurance coverage, you may receive a charge related to this service.  I need to obtain your verbal consent now. Are you willing to proceed with your visit today? Judy Johnson has provided verbal consent on 03/20/2024 for a virtual visit (video or telephone). Judy Johnson, [place]  Date: 03/20/2024 12:06 PM   Virtual Visit via Video Note   I, Judy Johnson, connected with  Judy Johnson  ([id number], [birth year]) on 03/20/24 at 12:00 PM EST by a video-enabled telemedicine application and verified that I am speaking with the correct person using two identifiers.  Location: Patient: Virtual Visit  Location Patient: Home Provider: Virtual Visit Location Provider: Home Office   I discussed the limitations of evaluation and management by telemedicine and the availability of in person appointments. The patient expressed understanding and agreed to proceed.    History of Present Illness: Judy Johnson is a 43 y.o. who identifies as a female who was assigned female at birth, and is being seen today for recurrence of hair loss/thinning noted over the past month or so.  Has history of alopecia, diagnosed by dermatology back in 2020.  Patient was previously on topical minoxidil and even received occasional scalp injections.  Unfortunately her dermatologist closed their practice in 2021 so she has not been seen for this since then.  Over the past month again noting substantial hair thinning and loss from the front of her scalp and also the right temporal region.  Denies any increase stressors.  Denies any rash of scalp, pain or itching.  Denies any change in routine medications.  Has continued a biotin supplement and D3 supplement since seeing her dermatologist.  Is currently without a primary care provider either as they left the practice.  HPI: HPI  Problems:  Patient Active Problem List   Diagnosis Date Noted   Vitamin D  deficiency 08/06/2021   Mixed hyperlipidemia 08/06/2021   Gastroesophageal reflux disease 08/06/2021   Chronic nonseasonal allergic rhinitis due to fungal spores 03/16/2016   LGSIL on Pap smear of cervix 02/06/2016   Cervical high risk HPV (human papillomavirus) test positive 02/06/2016    Allergies:  Allergies  Allergen Reactions   Cefzil  [Cefprozil ]     rash   Medications:  Current Outpatient Medications:    Minoxidil (MINOXIDIL FOR WOMEN) 5 % FOAM, Apply 1/2 capful once daily., Disp: 60 g, Rfl: 0  Observations/Objective: Patient is well-developed, well-nourished in no acute distress.  Resting comfortably  at home.  Head is normocephalic, atraumatic.  No labored  breathing.  Speech is clear and coherent with logical content.  Patient is alert and oriented at baseline.  Visible hair thinning noted of frontal area of scalp bilaterally as well as her right temple.  Skin looks clear and healthy.  Assessment and Plan: 1. Androgenic alopecia (Primary) - Minoxidil (MINOXIDIL FOR WOMEN) 5 % FOAM; Apply 1/2 capful once daily.  Dispense: 60 g; Refill: 0  Patient currently going through menopause.  Likely contributing to increase in hair loss recently.  Already with prior diagnosis of alopecia.  Will have her continue her vitamins.  Will start topical minoxidil and set her up with virtual Primary care for ongoing assessment and referral back to dermatology.  Follow Up Instructions: I discussed the assessment and treatment plan with the patient. The patient was provided an opportunity to ask questions and all were answered. The patient agreed with the plan and demonstrated an understanding of the instructions.  A copy of instructions were sent to the patient via MyChart unless otherwise noted below.   The patient was advised to call back or seek an in-person evaluation if the symptoms worsen or if the condition fails to improve as anticipated.    Judy Velma Lunger, PA-C

## 2024-03-21 ENCOUNTER — Telehealth: Admitting: Nurse Practitioner

## 2024-03-21 DIAGNOSIS — Z1231 Encounter for screening mammogram for malignant neoplasm of breast: Secondary | ICD-10-CM

## 2024-03-21 DIAGNOSIS — L659 Nonscarring hair loss, unspecified: Secondary | ICD-10-CM | POA: Diagnosis not present

## 2024-03-21 NOTE — Patient Instructions (Addendum)
 Mammogram number [phone] at [facility]   Dermatology referral [phone]

## 2024-03-21 NOTE — Progress Notes (Signed)
 Virtual Primary Care Telehealth Visit  Virtual Visit Consent  Judy Johnson, you are scheduled for a virtual visit with a [facility] provider today. Just as with appointments in the office, your consent must be obtained to participate. Your consent will be active for this visit and any virtual visit you may have with one of our providers in the next 365 days. If you have a MyChart account, a copy of this consent can be sent to you electronically.  By engaging in this virtual visit, you consent to the provision of healthcare and authorize for your insurance to be billed (if applicable) for the services provided during this visit. Depending on your insurance coverage, you may receive a charge related to this service.  I need to obtain your verbal consent now. Are you willing to proceed with your visit today? [name] has provided verbal consent on 03/21/2024 for a virtual visit. Lauraine Kitty, FNP  Date: 03/21/2024 2:25 PM  Virtual Visit via Video Note   I, Lauraine Kitty, connected with  Judy Johnson  ([id number], 21-Dec-1980) on 03/21/24 at  2:40 PM EST by a video-enabled telemedicine application and verified that I am speaking with the correct person using two identifiers.  This is an initial visit to discuss the opportunity to become a primary care patient at Home  The patient understands that if their medical background is complex, their case will be reviewed with the Medical Director, and if Virtual Primary Care is not the ideal location for their care, our team will help establish the patient with a primary care provider in the area.   If this is determined that this location is not the best option for the patient, in the future if the patient's medical condition changes we can re explore the option of this location serving as their primary care location.  The patient understands that by becoming a primary care patient, this would be the location for their primary care, and if they  chose to leave this location and seek primary care services at another location they will not be able to continue their relationship with this clinic.    Location: Patient: Virtual Visit Location Patient: [facility] visit location: Home  Provider: Virtual Visit Location Provider: Home Office Patient's husband is also present for visit   History of Present Illness:  Judy Johnson is a 43 y.o. who identifies as a female who was assigned female at birth, and is being seen today as a new patient with the [facility].   Her PCP retired during [employer], and she was also seeing a armed forces operational officer at that time who has also left their practice. Most recent visit was 2020   She is reaching out for assistance with referral back to dermatology for alopecia. She was previously getting injections in her scalp, and has used Minoxidil  as well, has started to notice that her hair is starting to thin again. She does take multivitamins and supplements.   Takes Vitamin D3 daily 5,000 units daily    She has not had a mammogram in the past she does have a history of breast implants from 2015.  Has not seen an OBGYN in recent years either  Medical history is significant for alopecia, GERD, vitamin D  deficiency   Did someone refer you for care here today? Other [facility]  Prior to today where were you receiving healthcare from? other primary care local (Retired)  Risk for admission to hospital or ED 0% If you didn't come  here for care today would you have gone somewhere else? nowhere When was the last time the patient sought medical attention? over one year   Do you have a current Primary Care Provider? No Have you seen a Primary Care Provider in the past? Yes   Problems:  Patient Active Problem List   Diagnosis Date Noted   Vitamin D  deficiency 08/06/2021   Mixed hyperlipidemia 08/06/2021   Gastroesophageal reflux disease 08/06/2021   Chronic nonseasonal allergic rhinitis due to fungal  spores 03/16/2016   LGSIL on Pap smear of cervix 02/06/2016   Cervical high risk HPV (human papillomavirus) test positive 02/06/2016    Allergies:  Allergies  Allergen Reactions   Cefzil  [Cefprozil ]     rash   Medications:  Current Outpatient Medications:    Minoxidil (MINOXIDIL FOR WOMEN) 5 % FOAM, Apply 1/2 capful once daily., Disp: 60 g, Rfl: 0  Observations/Objective: Physical Exam Constitutional:      General: She is not in acute distress.    Appearance: Normal appearance. She is not ill-appearing.  HENT:     Head: Atraumatic.     Nose: Nose normal.     Mouth/Throat:     Mouth: Mucous membranes are moist.  Pulmonary:     Effort: Pulmonary effort is normal.  Neurological:     Mental Status: She is alert and oriented to person, place, and time.  Psychiatric:        Mood and Affect: Mood normal.       Assessment and Plan:  1. Encounter for screening mammogram for malignant neoplasm of breast (Primary) - MM 3D SCREENING MAMMOGRAM BILATERAL BREAST; Future  2. Alopecia - Ambulatory referral to Dermatology - Hemoglobin A1c; Future - Comprehensive metabolic panel with GFR; Future - CBC with Differential/Platelet; Future - Lipid panel; Future - TSH; Future - VITAMIN D  25 Hydroxy (Vit-D Deficiency, Fractures); Future - Ferritin; Future    Follow Up Instructions: I discussed the assessment and treatment plan with the patient. The Telepresenter provided patient with a physical copy of my written instructions for review.   The patient was advised to call back or seek an in-person evaluation if the symptoms worsen or if the condition fails to improve as anticipated.    Lauraine Kitty, FNP  **Disclaimer: This note may have been dictated with voice recognition software. Similar sounding words can inadvertently be transcribed and this note may contain transcription errors which may not have been corrected upon publication of note.**

## 2024-03-23 ENCOUNTER — Other Ambulatory Visit: Payer: Self-pay | Admitting: Nurse Practitioner

## 2024-03-23 DIAGNOSIS — Z8639 Personal history of other endocrine, nutritional and metabolic disease: Secondary | ICD-10-CM

## 2024-03-23 DIAGNOSIS — E569 Vitamin deficiency, unspecified: Secondary | ICD-10-CM

## 2024-03-23 DIAGNOSIS — R5383 Other fatigue: Secondary | ICD-10-CM

## 2024-03-28 ENCOUNTER — Encounter: Payer: Self-pay | Admitting: Nurse Practitioner

## 2024-04-01 LAB — COMPREHENSIVE METABOLIC PANEL WITH GFR
ALT: 16 IU/L (ref 0–32)
AST: 17 IU/L (ref 0–40)
Albumin: 4.7 g/dL (ref 3.9–4.9)
Alkaline Phosphatase: 55 IU/L (ref 41–116)
BUN/Creatinine Ratio: 11 (ref 9–23)
BUN: 9 mg/dL (ref 6–24)
Bilirubin Total: 0.4 mg/dL (ref 0.0–1.2)
CO2: 22 mmol/L (ref 20–29)
Calcium: 9.9 mg/dL (ref 8.7–10.2)
Chloride: 103 mmol/L (ref 96–106)
Creatinine, Ser: 0.8 mg/dL (ref 0.57–1.00)
Globulin, Total: 3 g/dL (ref 1.5–4.5)
Glucose: 104 mg/dL — ABNORMAL HIGH (ref 70–99)
Potassium: 4.8 mmol/L (ref 3.5–5.2)
Sodium: 140 mmol/L (ref 134–144)
Total Protein: 7.7 g/dL (ref 6.0–8.5)
eGFR: 94 mL/min/1.73 (ref >59)

## 2024-04-01 LAB — CBC WITH DIFFERENTIAL/PLATELET
Basophils Absolute: 0.1 x10E3/uL (ref 0.0–0.2)
Basos: 1 %
EOS (ABSOLUTE): 0.6 x10E3/uL — ABNORMAL HIGH (ref 0.0–0.4)
Eos: 7 %
Hematocrit: 42.9 % (ref 34.0–46.6)
Hemoglobin: 13.7 g/dL (ref 11.1–15.9)
Immature Grans (Abs): 0 x10E3/uL (ref 0.0–0.1)
Immature Granulocytes: 0 %
Lymphocytes Absolute: 2.9 x10E3/uL (ref 0.7–3.1)
Lymphs: 35 %
MCH: 30.4 pg (ref 26.6–33.0)
MCHC: 31.9 g/dL (ref 31.5–35.7)
MCV: 95 fL (ref 79–97)
Monocytes Absolute: 0.6 x10E3/uL (ref 0.1–0.9)
Monocytes: 7 %
Neutrophils Absolute: 4 x10E3/uL (ref 1.4–7.0)
Neutrophils: 49 %
Platelets: 354 x10E3/uL (ref 150–450)
RBC: 4.51 x10E6/uL (ref 3.77–5.28)
RDW: 12.5 % (ref 11.7–15.4)
WBC: 8.1 x10E3/uL (ref 3.4–10.8)

## 2024-04-01 LAB — LIPID PANEL
Chol/HDL Ratio: 5.6 ratio — ABNORMAL HIGH (ref 0.0–4.4)
Cholesterol, Total: 254 mg/dL — ABNORMAL HIGH (ref 100–199)
HDL: 45 mg/dL (ref >39)
LDL Chol Calc (NIH): 155 mg/dL — ABNORMAL HIGH (ref 0–99)
Triglycerides: 290 mg/dL — ABNORMAL HIGH (ref 0–149)
VLDL Cholesterol Cal: 54 mg/dL — ABNORMAL HIGH (ref 5–40)

## 2024-04-01 LAB — HEMOGLOBIN A1C
Est. average glucose Bld gHb Est-mCnc: 114 mg/dL
Hgb A1c MFr Bld: 5.6 % (ref 4.8–5.6)

## 2024-04-01 LAB — TSH: TSH: 0.912 u[IU]/mL (ref 0.450–4.500)

## 2024-04-01 LAB — FERRITIN: Ferritin: 19 ng/mL

## 2024-04-01 LAB — VITAMIN D 25 HYDROXY (VIT D DEFICIENCY, FRACTURES): Vit D, 25-Hydroxy: 50.6 ng/mL (ref 30.0–100.0)

## 2024-04-02 ENCOUNTER — Ambulatory Visit: Payer: Self-pay | Admitting: Nurse Practitioner

## 2024-04-16 ENCOUNTER — Ambulatory Visit (HOSPITAL_COMMUNITY)
Admission: RE | Admit: 2024-04-16 | Discharge: 2024-04-16 | Disposition: A | Discharge status: Home / Self Care | Source: Ambulatory Visit | Attending: Nurse Practitioner | Admitting: Nurse Practitioner

## 2024-04-16 ENCOUNTER — Other Ambulatory Visit: Payer: Self-pay | Admitting: Nurse Practitioner

## 2024-04-16 ENCOUNTER — Encounter (HOSPITAL_COMMUNITY): Payer: Self-pay

## 2024-04-16 DIAGNOSIS — Z1231 Encounter for screening mammogram for malignant neoplasm of breast: Secondary | ICD-10-CM | POA: Diagnosis present

## 2024-04-16 DIAGNOSIS — L659 Nonscarring hair loss, unspecified: Secondary | ICD-10-CM

## 2024-04-20 ENCOUNTER — Ambulatory Visit: Payer: Self-pay | Admitting: Nurse Practitioner

## 2024-04-23 ENCOUNTER — Other Ambulatory Visit: Payer: Self-pay | Admitting: Nurse Practitioner

## 2024-04-23 DIAGNOSIS — L659 Nonscarring hair loss, unspecified: Secondary | ICD-10-CM

## 2024-07-25 ENCOUNTER — Ambulatory Visit: Admitting: Dermatology
